# Patient Record
Sex: Male | Born: 1935
Health system: Southern US, Community
[De-identification: ages and names within clinical notes are randomized; demographics above are authoritative.]

## PROBLEM LIST (undated history)

## (undated) DIAGNOSIS — Z87898 Personal history of other specified conditions: Secondary | ICD-10-CM

## (undated) DIAGNOSIS — T7840XA Allergy, unspecified, initial encounter: Secondary | ICD-10-CM

## (undated) DIAGNOSIS — I4891 Unspecified atrial fibrillation: Secondary | ICD-10-CM

## (undated) DIAGNOSIS — I10 Essential (primary) hypertension: Secondary | ICD-10-CM

## (undated) DIAGNOSIS — N183 Chronic kidney disease, stage 3 unspecified: Secondary | ICD-10-CM

## (undated) DIAGNOSIS — M109 Gout, unspecified: Secondary | ICD-10-CM

## (undated) DIAGNOSIS — I878 Other specified disorders of veins: Secondary | ICD-10-CM

## (undated) DIAGNOSIS — C61 Malignant neoplasm of prostate: Secondary | ICD-10-CM

## (undated) DIAGNOSIS — H409 Unspecified glaucoma: Secondary | ICD-10-CM

## (undated) DIAGNOSIS — C4491 Basal cell carcinoma of skin, unspecified: Secondary | ICD-10-CM

## (undated) DIAGNOSIS — E785 Hyperlipidemia, unspecified: Secondary | ICD-10-CM

## (undated) DIAGNOSIS — R7301 Impaired fasting glucose: Secondary | ICD-10-CM

## (undated) HISTORY — PX: COLON SURGERY: SHX602

## (undated) HISTORY — DX: Hyperlipidemia, unspecified: E78.5

## (undated) HISTORY — DX: Essential (primary) hypertension: I10

## (undated) HISTORY — DX: Chronic kidney disease, stage 3 (moderate): N18.3

## (undated) HISTORY — DX: Other specified disorders of veins: I87.8

## (undated) HISTORY — DX: Chronic kidney disease, stage 3 unspecified: N18.30

## (undated) HISTORY — DX: Impaired fasting glucose: R73.01

## (undated) HISTORY — DX: Allergy, unspecified, initial encounter: T78.40XA

## (undated) HISTORY — PX: PROSTATE SURGERY: SHX751

## (undated) HISTORY — DX: Gout, unspecified: M10.9

## (undated) HISTORY — DX: Unspecified atrial fibrillation: I48.91

## (undated) HISTORY — DX: Unspecified glaucoma: H40.9

## (undated) HISTORY — PX: APPENDECTOMY: SHX54

## (undated) HISTORY — DX: Personal history of other specified conditions: Z87.898

## (undated) HISTORY — DX: Basal cell carcinoma of skin, unspecified: C44.91

## (undated) HISTORY — DX: Malignant neoplasm of prostate: C61

## (undated) HISTORY — PX: TONSILECTOMY, ADENOIDECTOMY, BILATERAL MYRINGOTOMY AND TUBES: SHX2538

---

## 1998-03-09 ENCOUNTER — Emergency Department (HOSPITAL_COMMUNITY): Admission: EM | Admit: 1998-03-09 | Discharge: 1998-03-09 | Payer: Self-pay | Admitting: Emergency Medicine

## 1998-12-07 ENCOUNTER — Emergency Department (HOSPITAL_COMMUNITY): Admission: EM | Admit: 1998-12-07 | Discharge: 1998-12-07 | Payer: Self-pay | Admitting: Emergency Medicine

## 1998-12-07 ENCOUNTER — Encounter: Payer: Self-pay | Admitting: Emergency Medicine

## 2000-05-31 ENCOUNTER — Inpatient Hospital Stay (HOSPITAL_COMMUNITY): Admission: AD | Admit: 2000-05-31 | Discharge: 2000-06-03 | Payer: Self-pay | Admitting: *Deleted

## 2000-06-25 ENCOUNTER — Encounter: Payer: Self-pay | Admitting: *Deleted

## 2000-06-25 ENCOUNTER — Ambulatory Visit (HOSPITAL_COMMUNITY): Admission: RE | Admit: 2000-06-25 | Discharge: 2000-06-25 | Payer: Self-pay | Admitting: *Deleted

## 2000-08-02 ENCOUNTER — Ambulatory Visit (HOSPITAL_COMMUNITY): Admission: RE | Admit: 2000-08-02 | Discharge: 2000-08-02 | Payer: Self-pay | Admitting: Interventional Cardiology

## 2000-08-30 ENCOUNTER — Ambulatory Visit (HOSPITAL_COMMUNITY): Admission: RE | Admit: 2000-08-30 | Discharge: 2000-08-30 | Payer: Self-pay | Admitting: Interventional Cardiology

## 2000-11-01 ENCOUNTER — Encounter (INDEPENDENT_AMBULATORY_CARE_PROVIDER_SITE_OTHER): Payer: Self-pay

## 2000-11-01 ENCOUNTER — Ambulatory Visit (HOSPITAL_COMMUNITY): Admission: RE | Admit: 2000-11-01 | Discharge: 2000-11-01 | Payer: Self-pay | Admitting: *Deleted

## 2002-06-16 ENCOUNTER — Ambulatory Visit: Admission: RE | Admit: 2002-06-16 | Discharge: 2002-09-05 | Payer: Self-pay | Admitting: Radiation Oncology

## 2003-03-09 ENCOUNTER — Encounter: Payer: Self-pay | Admitting: Internal Medicine

## 2003-03-09 ENCOUNTER — Encounter: Admission: RE | Admit: 2003-03-09 | Discharge: 2003-03-09 | Payer: Self-pay | Admitting: Internal Medicine

## 2003-04-09 ENCOUNTER — Ambulatory Visit: Admission: RE | Admit: 2003-04-09 | Discharge: 2003-04-09 | Payer: Self-pay | Admitting: Radiation Oncology

## 2003-11-07 ENCOUNTER — Ambulatory Visit (HOSPITAL_COMMUNITY): Admission: RE | Admit: 2003-11-07 | Discharge: 2003-11-07 | Payer: Self-pay | Admitting: *Deleted

## 2003-11-07 ENCOUNTER — Encounter (INDEPENDENT_AMBULATORY_CARE_PROVIDER_SITE_OTHER): Payer: Self-pay | Admitting: *Deleted

## 2005-07-22 ENCOUNTER — Ambulatory Visit (HOSPITAL_COMMUNITY): Admission: RE | Admit: 2005-07-22 | Discharge: 2005-07-22 | Payer: Self-pay | Admitting: Internal Medicine

## 2008-10-11 ENCOUNTER — Encounter: Admission: RE | Admit: 2008-10-11 | Discharge: 2008-10-11 | Payer: Self-pay | Admitting: Internal Medicine

## 2009-05-24 ENCOUNTER — Emergency Department (HOSPITAL_COMMUNITY): Admission: EM | Admit: 2009-05-24 | Discharge: 2009-05-24 | Payer: Self-pay | Admitting: Emergency Medicine

## 2009-07-26 ENCOUNTER — Ambulatory Visit (HOSPITAL_BASED_OUTPATIENT_CLINIC_OR_DEPARTMENT_OTHER): Admission: RE | Admit: 2009-07-26 | Discharge: 2009-07-26 | Payer: Self-pay | Admitting: Urology

## 2010-09-11 LAB — BASIC METABOLIC PANEL
BUN: 22 mg/dL (ref 6–23)
CO2: 29 mEq/L (ref 19–32)
Calcium: 9.3 mg/dL (ref 8.4–10.5)
Chloride: 105 mEq/L (ref 96–112)
Creatinine, Ser: 1.32 mg/dL (ref 0.4–1.5)
GFR calc Af Amer: >60 mL/min (ref >60)
GFR calc non Af Amer: 52 mL/min — ABNORMAL LOW (ref >60)
Glucose, Bld: 122 mg/dL — ABNORMAL HIGH (ref 70–99)
Potassium: 4.5 mEq/L (ref 3.5–5.1)
Sodium: 138 mEq/L (ref 135–145)

## 2010-09-11 LAB — PROTIME-INR
INR: 0.97 (ref 0.00–1.49)
Prothrombin Time: 12.8 seconds (ref 11.6–15.2)

## 2010-09-11 LAB — HEMOGLOBIN: Hemoglobin: 14.6 g/dL (ref 13.0–17.0)

## 2010-09-23 LAB — CBC
HCT: 42.1 % (ref 39.0–52.0)
Platelets: 175 10*3/uL (ref 150–400)
RBC: 4.33 MIL/uL (ref 4.22–5.81)
WBC: 5.7 10*3/uL (ref 4.0–10.5)

## 2010-09-23 LAB — URINALYSIS, ROUTINE W REFLEX MICROSCOPIC
Bilirubin Urine: NEGATIVE
Glucose, UA: NEGATIVE mg/dL
Ketones, ur: NEGATIVE mg/dL
Protein, ur: >300 mg/dL — AB
Urobilinogen, UA: 0.2 mg/dL (ref 0.0–1.0)

## 2010-09-23 LAB — URINE MICROSCOPIC-ADD ON

## 2010-09-23 LAB — DIFFERENTIAL
Eosinophils Relative: 2 % (ref 0–5)
Lymphocytes Relative: 14 % (ref 12–46)
Lymphs Abs: 0.8 10*3/uL (ref 0.7–4.0)
Monocytes Relative: 12 % (ref 3–12)

## 2010-09-23 LAB — PROTIME-INR
INR: 2.89 — ABNORMAL HIGH (ref 0.00–1.49)
Prothrombin Time: 30 seconds — ABNORMAL HIGH (ref 11.6–15.2)

## 2010-11-07 NOTE — Cardiovascular Report (Signed)
[place]. [facility]  Patient:    Vincent Foster, Vincent Foster                    MRN: [id number] Proc. Date: 06/03/00 Adm. Date:  [id number] Attending:  Mora Appl CC:         Barbette Hair. Vaughan Basta., M.D.   Cardiac Catheterization  INDICATIONS FOR PROCEDURE:  Persistent atrial fibrillation.  DESCRIPTION OF PROCEDURE:  After obtaining written informed consent, anesthesia was provided by the anesthesia service.  Following appropriate sedation the patient was cardioverted from atrial fibrillation to sinus rhythm with a first-degree A-V block.  There was an occasional PVC.  The patient tolerated the procedure without adverse events.  He will be discharged to home.  He will continue with sotalol 80 mg p.o. b.i.d. as well as Coumadin for three weeks.  An INR will be obtained in four days as well as a repeat ECG. He will continue on Tiazac at 120 mg p.o. q.d. along with Cozaar for antihypertensive control.  IMPRESSION:  Successful cardioversion of atrial fibrillation to sinus rhythm with a first-degree A-V block. DD:  06/03/00 TD:  06/03/00 Job: [id number]

## 2010-11-07 NOTE — Op Note (Signed)
NAME:  Vincent Foster, Vincent Foster NO.:  [id number]   MEDICAL RECORD NO.:  [id number]                   PATIENT TYPE:  AMB   LOCATION:  ENDO                                 FACILITY:  [facility]   PHYSICIAN:  Georgiana Spinner, M.D.                 DATE OF BIRTH:  [birth year]   DATE OF PROCEDURE:  DATE OF DISCHARGE:                                 OPERATIVE REPORT   PROCEDURE:  Colonoscopy.   INDICATIONS FOR PROCEDURE:  Followup of colon polyps.   ANESTHESIA:  Demerol 30, Versed 1 mg.   DESCRIPTION OF PROCEDURE:  With the patient mildly sedated in the left  lateral decubitus position, a rectal exam was performed which was  unremarkable. Subsequently, the Olympus videoscopic colonoscope was inserted  in the rectum and passed under direct vision with pressure applied to the  abdomen.  We reached the cecum identified by the ileocecal valve and  appendiceal orifice both of which were photographed. From this point, the  colonoscope was slowly withdrawn taking circumferential views of the colonic  mucosa stopping only to photograph diverticular seen along the way in the  sigmoid colon which would be rated mild to moderate and stopping in the  rectum which appeared normal on direct and showed hemorrhoids on retroflexed  view. The endoscope was straightened and withdrawn. The patient's vital  signs and pulse oximeter remained stable. The patient tolerated the  procedure well without apparent complications.   FINDINGS:  Internal hemorrhoids and some diverticulosis in the sigmoid colon  otherwise an unremarkable exam.   PLAN:  Consider a repeat examination if applicable in five years.                                               Georgiana Spinner, M.D.    GMO/MEDQ  D:  11/07/2003  T:  11/07/2003  Job:  [id number]   cc:   Ike Bene, M.D.  [address]  [place]  [place] [address]  Fax: [phone]

## 2010-11-07 NOTE — Procedures (Signed)
[facility]  Patient:    Vincent Foster, Vincent Foster                    MRN: [id number] Proc. Date: 11/01/00 Adm. Date:  [id number] Attending:  Sabino Gasser                           Procedure Report  PROCEDURE:  Colonoscopy.  INDICATIONS:  Colon polyps.  ANESTHESIA:  Demerol 100 mg, Versed 9 mg.  DESCRIPTION OF PROCEDURE:  With the patient mildly sedated in the left lateral decubitus position, the Olympus videoscopic colonoscope was inserted into the rectum and passed under direct vision to the cecum.  To reach the cecum, we had to rotate the patient subsequently to the right lateral decubitus position with abdominal pressure applied.  In the cecum were two polyps, both fairly sessile and flat and could have easily been missed, but we photographed them and then using hot biopsy forceps technique and subsequently eradication technique with the hot biopsy forceps, setting of 20-20 blended current, these polyps were removed and/or eradicated.  The endoscope was then withdrawn, taking circumferential views of the entire colonic mucosa, stopping only in the rectum which appeared normal under direct view and showed small polyps on retroflex view which were thought may very well be hyperplastic, so they were photographed, and the largest one was cold biopsied only.  The endoscope was then straightened and withdrawn.  The patients vital signs and pulse oximeter remained stable.  The patient tolerated the procedure well without apparent complications.  FINDINGS:  Polyps at cecum, polyps of rectum.  Await biopsy report.  The patient will call me for results and follow up with me as an outpatient. Avoid Coumadin for the next three days. DD:  11/01/00 TD:  11/01/00 Job: [id number]

## 2010-11-07 NOTE — H&P (Signed)
[place]. [facility]  Patient:    Vincent Foster, Vincent Foster                    MRN: [id number] Adm. Date:  [id number] Attending:  Meade Maw A Dictator:   Anselm Lis, N.P.                         History and Physical  PRIMARY CARE Shaterria Sager:  Barbette Hair. Vaughan Basta., M.D.  DATE OF BIRTH:  02/28/1924  SUBJECTIVE:  [name] is a 77 year old gentleman initially seen in our clinic for a stress Cardiolite which was scheduled for symptoms of persistent shortness of breath.  At the time of the Cardiolite, which was mid-November, patient was noted to be in atrial fibrillation with his heart rate approximately 122 beats per minute.  He was initiated on Cardizem and Toprol, as well as warfarin.  His stress Cardiolite was notable for a small region of decreased uptake in the anterior wall from the base approaching the apex.  Since the patient was without chest pain, it was elected to proceed with medical management.  A 2-D echocardiogram performed on that day revealed EF of 45-50% with overall preserved LV function.  His left atrium was noted to be at 5.1.  He had mild aortic sclerosis without significant stenosis and mild MR.  He is now electively admitted for initiation of Betapace therapy and, following greater than 48 hours of Betapace, for elective direct-current cardioversion.  PREVIOUS MEDICAL HISTORY: 1. Atrial fibrillation, as above. 2. Hypertension. 3. Dislipidemia. 4. Allergic rhinitis. 5. Diverticulitis. 6. Question CAD with recent Cardiolite (mid-November 2001) revealing small    region of decreased uptake in the anterior wall from the base    approaching the apex.  PREVIOUS SURGICAL HISTORY:  A tonsillectomy and appendectomy.  ALLERGIES:  TETANUS, causing high fever.  MEDICATIONS: 1. Tiazac 240 mg p.o. q.d. 2. Toprol 100 mg p.o. q.d. 3. Cozaar 50 mg p.o. q.d. 4. Aspirin 325 mg q.d. 5. Multivitamin q.d. 6. Lipitor 10 mg p.o.  q.d. 7. Coumadin 5 mg a day except for Friday, at which time he takes a half    tablet. 8. Zyrtec 10 mg q.h.s. 9. Nasacort one puff each naris q.h.s.  SOCIAL HISTORY AND HABITS:  Patient is married for 52 years and has three children who are in good health.  He enjoys golfing and yard work.  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  He used to drink two scotches a day, but since starting the Coumadin has only had an occasional glass of wine.  Tobacco:  Negative.  FAMILY HISTORY:  Father deceased age 52.  Mother deceased age 36.  Sister is age 46 and in good health.  Has had a mastectomy.  Another sister who died of colon cancer at age 30.  REVIEW OF SYSTEMS:  As HPI/previous medical history.  Otherwise, denies problems with lightheadedness, dizziness, near-syncope, nor syncopal complaints.  Negative dysphagia to food or fluid.  Negative symptoms GERD. Denies melena nor bright red blood PR.  Does have nocturia of two to three times per night.  Negative pedal edema, orthopnea, nor PND.  PHYSICAL EXAMINATION:  VITAL SIGNS:  Blood pressure 180/90 with heart rate 80s and irregular. Temperature 96.5, respiratory rate 20.  He is 6 feet and weighs 241.9 pounds.  GENERAL:  Overweight older gentleman who appears younger than his 66 years of age.  His wife is in attendance.  HEENT:  Brisk bilateral carotid upstroke without bruit.  NECK:  No significant JVD nor thyromegaly.  CHEST:  Lung sounds clear with equal bilateral excursion.  CARDIAC:  Irregularly irregular rhythm.  Grade 2/6 holosystolic murmur best appreciated at the apex.  PMI appears to be within normal limits.  ABDOMEN:  Soft, nondistended, normoactive bowel sounds.  Negative abdominal aorta, no femoral bruit.  EXTREMITIES:  Negative pedal edema.  Good pedal pulses.  NEUROLOGIC:  Nonfocal.  Motor is 5/5 throughout.  GENITAL/RECTAL:  Deferred.  LABORATORY DATA:  EKG from today revealed atrial fibrillation  with ventricular response of 90 beats per minute.  No acute ischemic changes.  Occasional PVCs.  Stress Cardiolite from May 03, 2000 revealed decreased uptake anterior wall from the base approaching the apex, suggesting a previous anterior myocardial infarction involving the LAD.  BMET revealed sodium 136, potassium 4.2, chloride 104, CO2 24, glucose 94, BUN 12, creatinine 1.1, calcium 9.4, magnesium 1.9.  Pro time 27.3 with an INR of 3.8.  IMPRESSION: 1. Atrial fibrillation, basically asymptomatic except for shortness of breath    experienced with moderate exertion and when rate was not controlled.  He    is currently rate controlled on Cardizem and Toprol.  Toprol dose was    not taken this morning.  He is running about 90s. 2. Systemic anticoagulation secondary to #1.  He has been therapeutically    anticoagulated for greater than three weeks.  Management via our    Coumadin clinic. 3. Hypertension; elevated blood pressure upon admission today.  He had not    taken his Toprol dose this morning. 4. History of allergies; Zyrtec and Nasacort. 5. Question coronary artery disease.  Recent Cardiolite suspicious for scar in    anterior wall.  However, there is no complaint of chest pain though he    does have risk factors for CAD including age, history of dislipidemia,    hypertension.  Elected to follow medically at this time.  PLAN: 1. Elective admission for initiation of Betapace therapy towards possible    chemical cardioversion then, if failing this, elective direct-current    cardioversion after two full days of Betapace therapy (anticipate    Thursday morning, December 13 if remains in atrial fibrillation). 2. Will decrease warfarin to one-half tablet tonight with following of daily    INR and subsequent adjustment pending results. 3. Will discontinue Toprol in the setting of initiating Betapace therapy. DD:  05/31/00 TD:  05/31/00 Job: [id number]
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
## 2010-11-07 NOTE — Op Note (Signed)
NAME:  Vincent Foster, Vincent Foster NO.:  [id number]   MEDICAL RECORD NO.:  [id number]                   PATIENT TYPE:  AMB   LOCATION:  ENDO                                 FACILITY:  [facility]   PHYSICIAN:  Georgiana Spinner, M.D.                 DATE OF BIRTH:  1923/09/12   DATE OF PROCEDURE:  DATE OF DISCHARGE:                                 OPERATIVE REPORT   PROCEDURE:  Upper endoscopy.   INDICATIONS FOR PROCEDURE:  Gastroesophageal reflux disease.  Patient on  aspirin and Coumadin previously.   ANESTHESIA:  Demerol 50, Versed 8 mg.   DESCRIPTION OF PROCEDURE:  With the patient mildly sedated in the left  lateral decubitus position, the Olympus videoscopic endoscope was inserted  in the mouth and passed under direct vision through the esophagus which  appeared normal into the stomach. The fundus, body and antrum appeared  normal. The duodenal bulb showed some mild mucosal thickening consistent  with lymphoid hyperplasia. This was photographed and biopsied. The second  portion of the duodenum appeared normal.  From this point, the endoscope was  slowly withdrawn taking circumferential views of the duodenal mucosa until  the endoscope was then pulled back in the stomach, placed in retroflexion to  view the stomach from below. The endoscope was then straightened and  withdrawn taking circumferential views of the remaining gastric and  esophageal mucosa. The patient's vital signs and pulse oximeter remained  stable. The patient tolerated the procedure well without apparent  complications.   FINDINGS:  Duodenitis biopsied. Await biopsy report. The patient will call  me for results and followup with me as an outpatient. Proceed to colonoscopy  as planned.                                               Georgiana Spinner, M.D.    GMO/MEDQ  D:  11/07/2003  T:  11/07/2003  Job:  [id number]   cc:   Ike Bene, M.D.  [address]  [place]  [place] [address]  Fax: [phone]

## 2010-11-07 NOTE — Cardiovascular Report (Signed)
[place]. [facility]  Patient:    Vincent Foster, Vincent Foster                    MRN: [id number] Proc. Date: 08/02/00 Adm. Date:  [id number] Disc. Date: [id number] Attending:  Meade Maw A CC:         Judie Petit, M.D.  Barbette Hair. Vaughan Basta., M.D.   Cardiac Catheterization  INDICATIONS:  Atrial fibrillation.  The patient has been loaded with amiodarone for the past six weeks, and he is adequately anticoagulated with Coumadin with an INR today of 3.0.  Previous cardioversion was successful but he reverted to atrial fibrillation within 48 hours on Betapace.  PROCEDURES PERFORMED:  Elective electrical cardioversion.  DESCRIPTION OF PROCEDURE:  The patient was seen by anesthesiologist, Dr. Judie Petit.  After her evaluation, he was given sodium Pentothal.  A total of 275 mg was used to achieve sedation.  Cardioversion was then performed in the synchronized mode using the biphasic device at 300 joules. When, discharged led to normal sinus rhythm with PVCs.  Pulse conversion ECG revealed normal sinus rhythm, first-degree A-V block at 252 msec and occasional PVCs.  CONCLUSIONS:  Successful conversion from atrial fibrillation to normal sinus rhythm.  PLAN:  Continue medications as listed.  Will discontinued Tiazac.  Will continue Toprol 100 mg per day, amiodarone 200 mg per day.  Need to consider coronary angiography to define coronary anatomy given the abnormalities found on the patients Cardiolite study. DD:  08/02/00 TD:  08/03/00 Job: [id number]

## 2010-11-07 NOTE — Cardiovascular Report (Signed)
[place]. [facility]  Patient:    Vincent Foster, Vincent Foster                    MRN: [id number] Proc. Date: 08/30/00 Adm. Date:  [id number] Attending:  Lyn Records. Iii CC:         Barbette Hair. Vaughan Basta., M.D.   Cardiac Catheterization  INDICATIONS:  The patient has atrial fibrillation.  He has had atypical chest discomfort.  Cardiolite performed by Dr. Meade Maw was interpreted to show evidence of possible coronary artery disease.  This study is being done to define the patients coronary anatomy and rule out significant coronary artery disease.  CARDIOLOGIST:  Celso Sickle, M.D.  PROCEDURES PERFORMED: 1. Left heart catheterization. 2. Selective coronary angioplasty. 3. Left ventriculography. 4. Perclose arteriotomy closure.  DESCRIPTION:  After informed consent, a 6-French sheath was inserted into the right femoral artery using the modified Seldinger technique.  A 6-French A2 multipurpose catheter was used for hemodynamic recordings, left ventriculography, and coronary angiography.  Number 4 and 6-French left Judkins catheter was used for left coronary angiography.  The patient tolerated the diagnostic procedure without complications.  After coronary angiography, Perclose was then used to perform arteriotomy closure without complications.  IV Ancef 1 g was administered to prevent infection.  RESULTS: I.   Hemodynamic data:      a. Aortic pressure 162/72 mmHg.      b. Left ventricular pressure 157/16 mmHg.  II.  Left ventriculography:  The left ventricle is normal in size and      demonstrate normal contractility.  No mitral regurgitation is noted.      EF is at least 60%.  III. Selective coronary angiography.      a. Left main coronary: Normal.      b. Left anterior descending coronary: This is a large vessel that reaches         the left ventricular apex.  No discrete lesions are noted.  Minimal         luminal irregularities are  noted.      c. Circumflex artery: The circumflex coronary artery is large, giving         origin to a bifurcating obtuse marginal.  No significant obstruction         is noted.  Perhaps 30 to 40% narrowing is noted in the first obtuse         marginal mid segment.      d. Right coronary: The right coronary artery is large.  There is a         proximal 30% narrowing.  PDA is large.  Several small left ventricular         branches arise distally.  CONCLUSIONS: 1. Normal left ventricular function. 2. Minimal coronary atherosclerotic heart disease with 30 to 40% proximal    right coronary artery and 30 to 40% first obtuse marginal stenoses.    The left anterior descending artery contains no significant obstruction. 3. Chronic atrial fibrillation. 4. Successful Perclose.  PLAN:  Resume Coumadin.  Continue medications for atrial fibrillation. DD:  08/30/00 TD:  08/30/00 Job: [id number]

## 2010-12-10 ENCOUNTER — Other Ambulatory Visit: Payer: Self-pay | Admitting: Dermatology

## 2011-06-10 ENCOUNTER — Other Ambulatory Visit: Payer: Self-pay | Admitting: Dermatology

## 2011-06-26 DIAGNOSIS — Z7901 Long term (current) use of anticoagulants: Secondary | ICD-10-CM | POA: Diagnosis not present

## 2011-06-26 DIAGNOSIS — I4891 Unspecified atrial fibrillation: Secondary | ICD-10-CM | POA: Diagnosis not present

## 2011-07-01 DIAGNOSIS — M109 Gout, unspecified: Secondary | ICD-10-CM | POA: Diagnosis not present

## 2011-07-08 ENCOUNTER — Other Ambulatory Visit: Payer: Self-pay | Admitting: Dermatology

## 2011-07-08 DIAGNOSIS — C44320 Squamous cell carcinoma of skin of unspecified parts of face: Secondary | ICD-10-CM | POA: Diagnosis not present

## 2011-07-08 DIAGNOSIS — L821 Other seborrheic keratosis: Secondary | ICD-10-CM | POA: Diagnosis not present

## 2011-08-07 DIAGNOSIS — Z7901 Long term (current) use of anticoagulants: Secondary | ICD-10-CM | POA: Diagnosis not present

## 2011-08-07 DIAGNOSIS — I4891 Unspecified atrial fibrillation: Secondary | ICD-10-CM | POA: Diagnosis not present

## 2011-09-17 DIAGNOSIS — I4891 Unspecified atrial fibrillation: Secondary | ICD-10-CM | POA: Diagnosis not present

## 2011-09-17 DIAGNOSIS — Z7901 Long term (current) use of anticoagulants: Secondary | ICD-10-CM | POA: Diagnosis not present

## 2011-10-07 DIAGNOSIS — R269 Unspecified abnormalities of gait and mobility: Secondary | ICD-10-CM | POA: Diagnosis not present

## 2011-10-07 DIAGNOSIS — I10 Essential (primary) hypertension: Secondary | ICD-10-CM | POA: Diagnosis not present

## 2011-10-07 DIAGNOSIS — E785 Hyperlipidemia, unspecified: Secondary | ICD-10-CM | POA: Diagnosis not present

## 2011-10-14 DIAGNOSIS — H3530 Unspecified macular degeneration: Secondary | ICD-10-CM | POA: Diagnosis not present

## 2011-10-14 DIAGNOSIS — Z961 Presence of intraocular lens: Secondary | ICD-10-CM | POA: Diagnosis not present

## 2011-10-14 DIAGNOSIS — H02109 Unspecified ectropion of unspecified eye, unspecified eyelid: Secondary | ICD-10-CM | POA: Diagnosis not present

## 2011-10-14 DIAGNOSIS — D487 Neoplasm of uncertain behavior of other specified sites: Secondary | ICD-10-CM | POA: Diagnosis not present

## 2011-10-20 ENCOUNTER — Other Ambulatory Visit: Payer: Self-pay | Admitting: Ophthalmology

## 2011-10-20 DIAGNOSIS — H119 Unspecified disorder of conjunctiva: Secondary | ICD-10-CM | POA: Diagnosis not present

## 2011-10-20 DIAGNOSIS — D3100 Benign neoplasm of unspecified conjunctiva: Secondary | ICD-10-CM | POA: Diagnosis not present

## 2011-10-29 DIAGNOSIS — I4891 Unspecified atrial fibrillation: Secondary | ICD-10-CM | POA: Diagnosis not present

## 2011-10-29 DIAGNOSIS — Z7901 Long term (current) use of anticoagulants: Secondary | ICD-10-CM | POA: Diagnosis not present

## 2011-11-12 ENCOUNTER — Other Ambulatory Visit: Payer: Self-pay | Admitting: Dermatology

## 2011-11-12 DIAGNOSIS — C44320 Squamous cell carcinoma of skin of unspecified parts of face: Secondary | ICD-10-CM | POA: Diagnosis not present

## 2011-11-12 DIAGNOSIS — Z85828 Personal history of other malignant neoplasm of skin: Secondary | ICD-10-CM | POA: Diagnosis not present

## 2011-11-12 DIAGNOSIS — L821 Other seborrheic keratosis: Secondary | ICD-10-CM | POA: Diagnosis not present

## 2011-11-12 DIAGNOSIS — L570 Actinic keratosis: Secondary | ICD-10-CM | POA: Diagnosis not present

## 2011-11-12 DIAGNOSIS — D0439 Carcinoma in situ of skin of other parts of face: Secondary | ICD-10-CM | POA: Diagnosis not present

## 2011-11-19 DIAGNOSIS — M109 Gout, unspecified: Secondary | ICD-10-CM | POA: Diagnosis not present

## 2011-11-30 DIAGNOSIS — I4891 Unspecified atrial fibrillation: Secondary | ICD-10-CM | POA: Diagnosis not present

## 2011-11-30 DIAGNOSIS — Z7901 Long term (current) use of anticoagulants: Secondary | ICD-10-CM | POA: Diagnosis not present

## 2012-01-05 DIAGNOSIS — I4891 Unspecified atrial fibrillation: Secondary | ICD-10-CM | POA: Diagnosis not present

## 2012-01-05 DIAGNOSIS — Z7901 Long term (current) use of anticoagulants: Secondary | ICD-10-CM | POA: Diagnosis not present

## 2012-01-05 DIAGNOSIS — M109 Gout, unspecified: Secondary | ICD-10-CM | POA: Diagnosis not present

## 2012-01-19 DIAGNOSIS — I4891 Unspecified atrial fibrillation: Secondary | ICD-10-CM | POA: Diagnosis not present

## 2012-01-19 DIAGNOSIS — Z7901 Long term (current) use of anticoagulants: Secondary | ICD-10-CM | POA: Diagnosis not present

## 2012-01-26 DIAGNOSIS — M109 Gout, unspecified: Secondary | ICD-10-CM | POA: Diagnosis not present

## 2012-02-03 DIAGNOSIS — I10 Essential (primary) hypertension: Secondary | ICD-10-CM | POA: Diagnosis not present

## 2012-02-03 DIAGNOSIS — I4891 Unspecified atrial fibrillation: Secondary | ICD-10-CM | POA: Diagnosis not present

## 2012-02-03 DIAGNOSIS — I5032 Chronic diastolic (congestive) heart failure: Secondary | ICD-10-CM | POA: Diagnosis not present

## 2012-02-03 DIAGNOSIS — E785 Hyperlipidemia, unspecified: Secondary | ICD-10-CM | POA: Diagnosis not present

## 2012-02-03 DIAGNOSIS — Z7901 Long term (current) use of anticoagulants: Secondary | ICD-10-CM | POA: Diagnosis not present

## 2012-02-10 DIAGNOSIS — Z7901 Long term (current) use of anticoagulants: Secondary | ICD-10-CM | POA: Diagnosis not present

## 2012-02-10 DIAGNOSIS — I4891 Unspecified atrial fibrillation: Secondary | ICD-10-CM | POA: Diagnosis not present

## 2012-03-02 DIAGNOSIS — Z7901 Long term (current) use of anticoagulants: Secondary | ICD-10-CM | POA: Diagnosis not present

## 2012-03-02 DIAGNOSIS — I4891 Unspecified atrial fibrillation: Secondary | ICD-10-CM | POA: Diagnosis not present

## 2012-04-08 DIAGNOSIS — Z23 Encounter for immunization: Secondary | ICD-10-CM | POA: Diagnosis not present

## 2012-04-08 DIAGNOSIS — I10 Essential (primary) hypertension: Secondary | ICD-10-CM | POA: Diagnosis not present

## 2012-04-08 DIAGNOSIS — M109 Gout, unspecified: Secondary | ICD-10-CM | POA: Diagnosis not present

## 2012-04-08 DIAGNOSIS — Z7901 Long term (current) use of anticoagulants: Secondary | ICD-10-CM | POA: Diagnosis not present

## 2012-04-08 DIAGNOSIS — G479 Sleep disorder, unspecified: Secondary | ICD-10-CM | POA: Diagnosis not present

## 2012-04-08 DIAGNOSIS — I4891 Unspecified atrial fibrillation: Secondary | ICD-10-CM | POA: Diagnosis not present

## 2012-04-13 ENCOUNTER — Other Ambulatory Visit: Payer: Self-pay | Admitting: Dermatology

## 2012-04-13 DIAGNOSIS — D0470 Carcinoma in situ of skin of unspecified lower limb, including hip: Secondary | ICD-10-CM | POA: Diagnosis not present

## 2012-04-13 DIAGNOSIS — Z85828 Personal history of other malignant neoplasm of skin: Secondary | ICD-10-CM | POA: Diagnosis not present

## 2012-04-13 DIAGNOSIS — D0439 Carcinoma in situ of skin of other parts of face: Secondary | ICD-10-CM | POA: Diagnosis not present

## 2012-04-13 DIAGNOSIS — L821 Other seborrheic keratosis: Secondary | ICD-10-CM | POA: Diagnosis not present

## 2012-04-13 DIAGNOSIS — L570 Actinic keratosis: Secondary | ICD-10-CM | POA: Diagnosis not present

## 2012-04-26 ENCOUNTER — Ambulatory Visit: Payer: Medicare Other

## 2012-04-26 ENCOUNTER — Ambulatory Visit (INDEPENDENT_AMBULATORY_CARE_PROVIDER_SITE_OTHER): Payer: Medicare Other | Admitting: Family Medicine

## 2012-04-26 ENCOUNTER — Other Ambulatory Visit: Payer: Self-pay

## 2012-04-26 VITALS — BP 152/102 | HR 106 | Temp 97.9°F | Resp 18 | Wt 247.0 lb

## 2012-04-26 DIAGNOSIS — S96912A Strain of unspecified muscle and tendon at ankle and foot level, left foot, initial encounter: Secondary | ICD-10-CM

## 2012-04-26 DIAGNOSIS — M79673 Pain in unspecified foot: Secondary | ICD-10-CM

## 2012-04-26 DIAGNOSIS — IMO0002 Reserved for concepts with insufficient information to code with codable children: Secondary | ICD-10-CM

## 2012-04-26 DIAGNOSIS — M79609 Pain in unspecified limb: Secondary | ICD-10-CM | POA: Diagnosis not present

## 2012-04-26 DIAGNOSIS — S93409A Sprain of unspecified ligament of unspecified ankle, initial encounter: Secondary | ICD-10-CM | POA: Diagnosis not present

## 2012-04-26 DIAGNOSIS — M25473 Effusion, unspecified ankle: Secondary | ICD-10-CM | POA: Diagnosis not present

## 2012-04-26 DIAGNOSIS — M25476 Effusion, unspecified foot: Secondary | ICD-10-CM

## 2012-04-26 DIAGNOSIS — M25579 Pain in unspecified ankle and joints of unspecified foot: Secondary | ICD-10-CM | POA: Diagnosis not present

## 2012-04-26 NOTE — Progress Notes (Signed)
[address]   [place], [place]  [address]   [phone]  Subjective:    Patient ID: Vincent Foster, male    DOB: [birth year], 78 y.o.   MRN: [id number]  HPIThis 78 y.o. male presents for evaluation of L foot pain.  Larey Seat five days ago. Larey Seat down a few steps; caught self with raling.  Swelling lateral ankle and foot.  Grandson had CAM walker/boot and has been wearing boot.  Also using walker.  Has been using a cane at baseline.   Rare falls.  Mild numbness in foot.   No ice or heat.  No medication.  Takes Coumadin; last INR three weeks ago.  No change in dose; INR 2.5. Called Dr. Valentina Lucks and recommended xray.     Review of Systems  Constitutional: Negative for fever, chills, diaphoresis and fatigue.  Musculoskeletal: Positive for myalgias, joint swelling and arthralgias.  Skin: Negative for color change, pallor, rash and wound.  Neurological: Positive for numbness. Negative for weakness.    Past Medical History  Diagnosis Date  . Allergy   . Glaucoma   . Atrial fibrillation   . Hyperlipidemia   . Hypertension   . Venous stasis     Lasix PRN.    Past Surgical History  Procedure Date  . Appendectomy   . Prostate surgery     Had gold seed placement  . Tonsilectomy, adenoidectomy, bilateral myringotomy and tubes   . Colon surgery     Prior to Admission medications   Medication Sig Start Date End Date Taking? Authorizing Provider  aspirin 81 MG tablet Take 81 mg by mouth daily.   Yes Historical Provider, MD  furosemide (LASIX) 20 MG tablet Take 20 mg by mouth 2 (two) times daily.   Yes Historical Provider, MD  losartan (COZAAR) 50 MG tablet Take 50 mg by mouth daily.   Yes Historical Provider, MD  metoprolol succinate (TOPROL-XL) 50 MG 24 hr tablet Take 50 mg by mouth daily. Take with or immediately following a meal.   Yes Historical Provider, MD  simvastatin (ZOCOR) 40 MG tablet Take 40 mg by mouth every evening.   Yes Historical Provider, MD  warfarin (COUMADIN) 3 MG  tablet Take 3 mg by mouth daily.   Yes Historical Provider, MD    Allergies  Allergen Reactions  . Tetanus Toxoids Other (See Comments)    He ran a real high fever    History   Social History  . Marital Status: Married    Spouse Name: N/A    Number of Children: N/A  . Years of Education: N/A   Occupational History  . Not on file.   Social History Main Topics  . Smoking status: Former Games developer  . Smokeless tobacco: Not on file  . Alcohol Use: Not on file  . Drug Use: No  . Sexually Active: Not on file   Other Topics Concern  . Not on file   Social History Narrative  . No narrative on file    No family history on file.     Objective:   Physical Exam  Nursing note and vitals reviewed. Constitutional: He is oriented to person, place, and time. He appears well-developed and well-nourished. No distress.  Musculoskeletal:       Left ankle: He exhibits decreased range of motion and swelling. He exhibits no ecchymosis, no laceration and normal pulse. tenderness. Head of 5th metatarsal tenderness found. No lateral malleolus, no medial malleolus and no proximal fibula tenderness found. Achilles  tendon normal.       Left foot: He exhibits decreased range of motion, tenderness, bony tenderness and swelling. He exhibits normal capillary refill, no crepitus, no deformity and no laceration.       L FOOT/ANKLE: MODERATE SWELLING LATERAL ANKLE; NON-TENDER ALONG LATERAL MALLEOLUS.  +TTP INFERIOR TO LATERAL MALLEOLUS.  +SWELLING PROXIMAL FOOT; +TTP 5TH METATARSAL PROXIMALLY; MILD TTP TARSAL REGION PROXIMAL FOOT.  FULL DORSIFLEXION, PLANTAR FLEXION.   Neurological: He is alert and oriented to person, place, and time. No cranial nerve deficit. He exhibits normal muscle tone.  Skin: Skin is warm and dry. No rash noted. He is not diaphoretic. No erythema.       NO ECCHYMOSES.  Psychiatric: He has a normal mood and affect. His behavior is normal. Judgment and thought content normal.     [facility]  reading (PRIMARY) by  Dr. Katrinka Blazing.  L ANKLE:  NAD L FOOT:  NAD   Assessment & Plan:   1. Foot pain  DG Foot Complete Left, DG Ankle Complete Left  2. Ankle pain    3. Strain of ankle or foot, left    4. Swelling of joint of left ankle or foot      1.  Foot/Ankle Pain L:  New. Recommend rest, ice, elevation, Tylenol. 2.  Foot/Ankle Contusion L;  New.  Continue CAM walker, ambulating with walker.  Recommend rest, elevation, ice.

## 2012-04-26 NOTE — Patient Instructions (Addendum)
1. Foot pain  DG Foot Complete Left, DG Ankle Complete Left  2. Ankle pain    3. Strain of ankle or foot, left    4. Swelling of joint of left ankle or foot

## 2012-06-09 DIAGNOSIS — I4891 Unspecified atrial fibrillation: Secondary | ICD-10-CM | POA: Diagnosis not present

## 2012-06-09 DIAGNOSIS — Z7901 Long term (current) use of anticoagulants: Secondary | ICD-10-CM | POA: Diagnosis not present

## 2012-07-07 DIAGNOSIS — I4891 Unspecified atrial fibrillation: Secondary | ICD-10-CM | POA: Diagnosis not present

## 2012-07-07 DIAGNOSIS — Z7901 Long term (current) use of anticoagulants: Secondary | ICD-10-CM | POA: Diagnosis not present

## 2012-07-18 DIAGNOSIS — Z961 Presence of intraocular lens: Secondary | ICD-10-CM | POA: Diagnosis not present

## 2012-07-18 DIAGNOSIS — H2640 Unspecified secondary cataract: Secondary | ICD-10-CM | POA: Diagnosis not present

## 2012-07-18 DIAGNOSIS — H43819 Vitreous degeneration, unspecified eye: Secondary | ICD-10-CM | POA: Diagnosis not present

## 2012-07-21 DIAGNOSIS — H26499 Other secondary cataract, unspecified eye: Secondary | ICD-10-CM | POA: Diagnosis not present

## 2012-07-21 DIAGNOSIS — H2640 Unspecified secondary cataract: Secondary | ICD-10-CM | POA: Diagnosis not present

## 2012-08-01 NOTE — Progress Notes (Signed)
Reviewed and agree.

## 2012-08-18 DIAGNOSIS — Z7901 Long term (current) use of anticoagulants: Secondary | ICD-10-CM | POA: Diagnosis not present

## 2012-08-18 DIAGNOSIS — I4891 Unspecified atrial fibrillation: Secondary | ICD-10-CM | POA: Diagnosis not present

## 2012-08-31 ENCOUNTER — Other Ambulatory Visit: Payer: Self-pay | Admitting: Dermatology

## 2012-08-31 DIAGNOSIS — D485 Neoplasm of uncertain behavior of skin: Secondary | ICD-10-CM | POA: Diagnosis not present

## 2012-08-31 DIAGNOSIS — C44319 Basal cell carcinoma of skin of other parts of face: Secondary | ICD-10-CM | POA: Diagnosis not present

## 2012-08-31 DIAGNOSIS — Z85828 Personal history of other malignant neoplasm of skin: Secondary | ICD-10-CM | POA: Diagnosis not present

## 2012-08-31 DIAGNOSIS — D0470 Carcinoma in situ of skin of unspecified lower limb, including hip: Secondary | ICD-10-CM | POA: Diagnosis not present

## 2012-08-31 DIAGNOSIS — L570 Actinic keratosis: Secondary | ICD-10-CM | POA: Diagnosis not present

## 2012-08-31 DIAGNOSIS — L821 Other seborrheic keratosis: Secondary | ICD-10-CM | POA: Diagnosis not present

## 2012-09-29 DIAGNOSIS — I4891 Unspecified atrial fibrillation: Secondary | ICD-10-CM | POA: Diagnosis not present

## 2012-09-29 DIAGNOSIS — Z7901 Long term (current) use of anticoagulants: Secondary | ICD-10-CM | POA: Diagnosis not present

## 2012-10-03 DIAGNOSIS — C44319 Basal cell carcinoma of skin of other parts of face: Secondary | ICD-10-CM | POA: Diagnosis not present

## 2012-10-17 ENCOUNTER — Other Ambulatory Visit: Payer: Self-pay | Admitting: Dermatology

## 2012-10-17 DIAGNOSIS — C44529 Squamous cell carcinoma of skin of other part of trunk: Secondary | ICD-10-CM | POA: Diagnosis not present

## 2012-10-17 DIAGNOSIS — D485 Neoplasm of uncertain behavior of skin: Secondary | ICD-10-CM | POA: Diagnosis not present

## 2012-10-28 DIAGNOSIS — I10 Essential (primary) hypertension: Secondary | ICD-10-CM | POA: Diagnosis not present

## 2012-10-28 DIAGNOSIS — J449 Chronic obstructive pulmonary disease, unspecified: Secondary | ICD-10-CM | POA: Diagnosis not present

## 2012-10-28 DIAGNOSIS — E785 Hyperlipidemia, unspecified: Secondary | ICD-10-CM | POA: Diagnosis not present

## 2012-10-28 DIAGNOSIS — Z1331 Encounter for screening for depression: Secondary | ICD-10-CM | POA: Diagnosis not present

## 2012-10-31 ENCOUNTER — Other Ambulatory Visit: Payer: Self-pay | Admitting: Dermatology

## 2012-10-31 DIAGNOSIS — C44529 Squamous cell carcinoma of skin of other part of trunk: Secondary | ICD-10-CM | POA: Diagnosis not present

## 2012-11-10 DIAGNOSIS — Z7901 Long term (current) use of anticoagulants: Secondary | ICD-10-CM | POA: Diagnosis not present

## 2012-11-10 DIAGNOSIS — I4891 Unspecified atrial fibrillation: Secondary | ICD-10-CM | POA: Diagnosis not present

## 2012-12-22 DIAGNOSIS — I4891 Unspecified atrial fibrillation: Secondary | ICD-10-CM | POA: Diagnosis not present

## 2012-12-22 DIAGNOSIS — Z7901 Long term (current) use of anticoagulants: Secondary | ICD-10-CM | POA: Diagnosis not present

## 2013-01-03 ENCOUNTER — Other Ambulatory Visit: Payer: Self-pay | Admitting: Dermatology

## 2013-01-03 DIAGNOSIS — Z85828 Personal history of other malignant neoplasm of skin: Secondary | ICD-10-CM | POA: Diagnosis not present

## 2013-01-03 DIAGNOSIS — L570 Actinic keratosis: Secondary | ICD-10-CM | POA: Diagnosis not present

## 2013-01-03 DIAGNOSIS — D485 Neoplasm of uncertain behavior of skin: Secondary | ICD-10-CM | POA: Diagnosis not present

## 2013-01-03 DIAGNOSIS — L820 Inflamed seborrheic keratosis: Secondary | ICD-10-CM | POA: Diagnosis not present

## 2013-01-03 DIAGNOSIS — L821 Other seborrheic keratosis: Secondary | ICD-10-CM | POA: Diagnosis not present

## 2013-01-03 DIAGNOSIS — D0470 Carcinoma in situ of skin of unspecified lower limb, including hip: Secondary | ICD-10-CM | POA: Diagnosis not present

## 2013-02-02 DIAGNOSIS — I4891 Unspecified atrial fibrillation: Secondary | ICD-10-CM | POA: Diagnosis not present

## 2013-02-02 DIAGNOSIS — Z7901 Long term (current) use of anticoagulants: Secondary | ICD-10-CM | POA: Diagnosis not present

## 2013-02-08 DIAGNOSIS — I4891 Unspecified atrial fibrillation: Secondary | ICD-10-CM | POA: Diagnosis not present

## 2013-02-08 DIAGNOSIS — Z7901 Long term (current) use of anticoagulants: Secondary | ICD-10-CM | POA: Diagnosis not present

## 2013-02-08 DIAGNOSIS — I5032 Chronic diastolic (congestive) heart failure: Secondary | ICD-10-CM | POA: Diagnosis not present

## 2013-02-08 DIAGNOSIS — J449 Chronic obstructive pulmonary disease, unspecified: Secondary | ICD-10-CM | POA: Diagnosis not present

## 2013-02-08 DIAGNOSIS — R4181 Age-related cognitive decline: Secondary | ICD-10-CM | POA: Diagnosis not present

## 2013-02-24 DIAGNOSIS — I4891 Unspecified atrial fibrillation: Secondary | ICD-10-CM | POA: Diagnosis not present

## 2013-02-24 DIAGNOSIS — Z7901 Long term (current) use of anticoagulants: Secondary | ICD-10-CM | POA: Diagnosis not present

## 2013-03-09 DIAGNOSIS — H3530 Unspecified macular degeneration: Secondary | ICD-10-CM | POA: Diagnosis not present

## 2013-03-09 DIAGNOSIS — H01009 Unspecified blepharitis unspecified eye, unspecified eyelid: Secondary | ICD-10-CM | POA: Diagnosis not present

## 2013-03-09 DIAGNOSIS — H04229 Epiphora due to insufficient drainage, unspecified lacrimal gland: Secondary | ICD-10-CM | POA: Diagnosis not present

## 2013-03-09 DIAGNOSIS — H0019 Chalazion unspecified eye, unspecified eyelid: Secondary | ICD-10-CM | POA: Diagnosis not present

## 2013-04-01 ENCOUNTER — Other Ambulatory Visit: Payer: Self-pay | Admitting: Interventional Cardiology

## 2013-04-03 ENCOUNTER — Ambulatory Visit (INDEPENDENT_AMBULATORY_CARE_PROVIDER_SITE_OTHER): Payer: Medicare Other | Admitting: Pharmacist

## 2013-04-03 DIAGNOSIS — I4891 Unspecified atrial fibrillation: Secondary | ICD-10-CM | POA: Diagnosis not present

## 2013-04-11 ENCOUNTER — Other Ambulatory Visit: Payer: Self-pay | Admitting: Interventional Cardiology

## 2013-04-12 DIAGNOSIS — R7989 Other specified abnormal findings of blood chemistry: Secondary | ICD-10-CM | POA: Diagnosis not present

## 2013-04-12 DIAGNOSIS — J449 Chronic obstructive pulmonary disease, unspecified: Secondary | ICD-10-CM | POA: Diagnosis not present

## 2013-04-12 DIAGNOSIS — M109 Gout, unspecified: Secondary | ICD-10-CM | POA: Diagnosis not present

## 2013-04-12 DIAGNOSIS — Z1331 Encounter for screening for depression: Secondary | ICD-10-CM | POA: Diagnosis not present

## 2013-04-12 DIAGNOSIS — I10 Essential (primary) hypertension: Secondary | ICD-10-CM | POA: Diagnosis not present

## 2013-04-12 DIAGNOSIS — Z23 Encounter for immunization: Secondary | ICD-10-CM | POA: Diagnosis not present

## 2013-04-12 DIAGNOSIS — Z0000 Encounter for general adult medical examination without abnormal findings: Secondary | ICD-10-CM | POA: Diagnosis not present

## 2013-04-12 DIAGNOSIS — E785 Hyperlipidemia, unspecified: Secondary | ICD-10-CM | POA: Diagnosis not present

## 2013-05-15 ENCOUNTER — Ambulatory Visit (INDEPENDENT_AMBULATORY_CARE_PROVIDER_SITE_OTHER): Payer: Medicare Other | Admitting: Pharmacist

## 2013-05-15 ENCOUNTER — Other Ambulatory Visit: Payer: Self-pay | Admitting: Dermatology

## 2013-05-15 DIAGNOSIS — I4891 Unspecified atrial fibrillation: Secondary | ICD-10-CM

## 2013-05-15 DIAGNOSIS — D044 Carcinoma in situ of skin of scalp and neck: Secondary | ICD-10-CM | POA: Diagnosis not present

## 2013-05-15 DIAGNOSIS — L570 Actinic keratosis: Secondary | ICD-10-CM | POA: Diagnosis not present

## 2013-05-15 DIAGNOSIS — L821 Other seborrheic keratosis: Secondary | ICD-10-CM | POA: Diagnosis not present

## 2013-05-15 DIAGNOSIS — D485 Neoplasm of uncertain behavior of skin: Secondary | ICD-10-CM | POA: Diagnosis not present

## 2013-05-15 DIAGNOSIS — L538 Other specified erythematous conditions: Secondary | ICD-10-CM | POA: Diagnosis not present

## 2013-05-15 DIAGNOSIS — Z85828 Personal history of other malignant neoplasm of skin: Secondary | ICD-10-CM | POA: Diagnosis not present

## 2013-05-15 DIAGNOSIS — D045 Carcinoma in situ of skin of trunk: Secondary | ICD-10-CM | POA: Diagnosis not present

## 2013-05-15 LAB — POCT INR: INR: 2.4

## 2013-05-23 DIAGNOSIS — Z85828 Personal history of other malignant neoplasm of skin: Secondary | ICD-10-CM | POA: Diagnosis not present

## 2013-05-23 DIAGNOSIS — D044 Carcinoma in situ of skin of scalp and neck: Secondary | ICD-10-CM | POA: Diagnosis not present

## 2013-06-26 ENCOUNTER — Ambulatory Visit (INDEPENDENT_AMBULATORY_CARE_PROVIDER_SITE_OTHER): Payer: Medicare Other | Admitting: Pharmacist

## 2013-06-26 DIAGNOSIS — I4891 Unspecified atrial fibrillation: Secondary | ICD-10-CM | POA: Diagnosis not present

## 2013-06-26 LAB — POCT INR: INR: 2.1

## 2013-07-18 DIAGNOSIS — Z85828 Personal history of other malignant neoplasm of skin: Secondary | ICD-10-CM | POA: Diagnosis not present

## 2013-07-18 DIAGNOSIS — L918 Other hypertrophic disorders of the skin: Secondary | ICD-10-CM | POA: Diagnosis not present

## 2013-08-02 DIAGNOSIS — H11009 Unspecified pterygium of unspecified eye: Secondary | ICD-10-CM | POA: Diagnosis not present

## 2013-08-02 DIAGNOSIS — H43819 Vitreous degeneration, unspecified eye: Secondary | ICD-10-CM | POA: Diagnosis not present

## 2013-08-02 DIAGNOSIS — H3530 Unspecified macular degeneration: Secondary | ICD-10-CM | POA: Diagnosis not present

## 2013-08-02 DIAGNOSIS — Z961 Presence of intraocular lens: Secondary | ICD-10-CM | POA: Diagnosis not present

## 2013-08-07 DIAGNOSIS — J329 Chronic sinusitis, unspecified: Secondary | ICD-10-CM | POA: Diagnosis not present

## 2013-08-22 ENCOUNTER — Ambulatory Visit (INDEPENDENT_AMBULATORY_CARE_PROVIDER_SITE_OTHER): Payer: Medicare Other | Admitting: Pharmacist

## 2013-08-22 DIAGNOSIS — I4891 Unspecified atrial fibrillation: Secondary | ICD-10-CM | POA: Diagnosis not present

## 2013-08-22 LAB — POCT INR: INR: 2.9

## 2013-09-30 ENCOUNTER — Other Ambulatory Visit: Payer: Self-pay | Admitting: Interventional Cardiology

## 2013-10-03 ENCOUNTER — Ambulatory Visit (INDEPENDENT_AMBULATORY_CARE_PROVIDER_SITE_OTHER): Payer: Medicare Other | Admitting: Pharmacist Clinician (PhC)/ Clinical Pharmacy Specialist

## 2013-10-03 DIAGNOSIS — I4891 Unspecified atrial fibrillation: Secondary | ICD-10-CM

## 2013-10-03 LAB — POCT INR: INR: 2.8

## 2013-10-09 ENCOUNTER — Other Ambulatory Visit: Payer: Self-pay | Admitting: Interventional Cardiology

## 2013-10-12 DIAGNOSIS — E785 Hyperlipidemia, unspecified: Secondary | ICD-10-CM | POA: Diagnosis not present

## 2013-10-12 DIAGNOSIS — Z23 Encounter for immunization: Secondary | ICD-10-CM | POA: Diagnosis not present

## 2013-10-12 DIAGNOSIS — I10 Essential (primary) hypertension: Secondary | ICD-10-CM | POA: Diagnosis not present

## 2013-10-12 DIAGNOSIS — R269 Unspecified abnormalities of gait and mobility: Secondary | ICD-10-CM | POA: Diagnosis not present

## 2013-10-12 DIAGNOSIS — N183 Chronic kidney disease, stage 3 unspecified: Secondary | ICD-10-CM | POA: Diagnosis not present

## 2013-10-12 DIAGNOSIS — M109 Gout, unspecified: Secondary | ICD-10-CM | POA: Diagnosis not present

## 2013-11-09 DIAGNOSIS — L570 Actinic keratosis: Secondary | ICD-10-CM | POA: Diagnosis not present

## 2013-11-09 DIAGNOSIS — Z85828 Personal history of other malignant neoplasm of skin: Secondary | ICD-10-CM | POA: Diagnosis not present

## 2013-11-14 ENCOUNTER — Ambulatory Visit (INDEPENDENT_AMBULATORY_CARE_PROVIDER_SITE_OTHER): Payer: Medicare Other | Admitting: *Deleted

## 2013-11-14 DIAGNOSIS — I4891 Unspecified atrial fibrillation: Secondary | ICD-10-CM | POA: Diagnosis not present

## 2013-11-14 LAB — POCT INR: INR: 3.8

## 2013-12-04 ENCOUNTER — Ambulatory Visit (INDEPENDENT_AMBULATORY_CARE_PROVIDER_SITE_OTHER): Payer: Medicare Other | Admitting: *Deleted

## 2013-12-04 DIAGNOSIS — I4891 Unspecified atrial fibrillation: Secondary | ICD-10-CM | POA: Diagnosis not present

## 2013-12-04 LAB — POCT INR: INR: 2.2

## 2013-12-07 ENCOUNTER — Encounter: Payer: Self-pay | Admitting: *Deleted

## 2013-12-28 ENCOUNTER — Other Ambulatory Visit: Payer: Self-pay | Admitting: Interventional Cardiology

## 2014-01-01 ENCOUNTER — Ambulatory Visit (INDEPENDENT_AMBULATORY_CARE_PROVIDER_SITE_OTHER): Payer: Medicare Other | Admitting: Pharmacist

## 2014-01-01 DIAGNOSIS — I4891 Unspecified atrial fibrillation: Secondary | ICD-10-CM

## 2014-01-01 LAB — POCT INR: INR: 2.3

## 2014-01-03 ENCOUNTER — Other Ambulatory Visit: Payer: Self-pay | Admitting: Interventional Cardiology

## 2014-02-09 ENCOUNTER — Other Ambulatory Visit: Payer: Self-pay | Admitting: Cardiology

## 2014-02-12 ENCOUNTER — Ambulatory Visit (INDEPENDENT_AMBULATORY_CARE_PROVIDER_SITE_OTHER): Payer: Medicare Other | Admitting: *Deleted

## 2014-02-12 ENCOUNTER — Telehealth: Payer: Self-pay | Admitting: Pharmacist

## 2014-02-12 DIAGNOSIS — I4891 Unspecified atrial fibrillation: Secondary | ICD-10-CM | POA: Diagnosis not present

## 2014-02-12 LAB — POCT INR: INR: 3.2

## 2014-02-12 NOTE — Telephone Encounter (Signed)
Patient on both aspirin and warfarin currently.  No history of CABG, CAD, MI, PCI, etc.  Patient is 80 years old.  No history of bleeding.  He is due to see you in next few weeks for his annual.  Do you want patient to d/c aspirin, or continue both warfarin and aspirin.

## 2014-02-13 NOTE — Telephone Encounter (Signed)
Yes

## 2014-02-14 NOTE — Telephone Encounter (Signed)
Confirmed with Dr. Tamala Julian that he wanted patient to discontinue aspirin. Patient notified, and med list updated.

## 2014-03-06 ENCOUNTER — Encounter: Payer: Self-pay | Admitting: Interventional Cardiology

## 2014-03-06 ENCOUNTER — Ambulatory Visit (INDEPENDENT_AMBULATORY_CARE_PROVIDER_SITE_OTHER): Payer: Medicare Other | Admitting: Interventional Cardiology

## 2014-03-06 ENCOUNTER — Ambulatory Visit (INDEPENDENT_AMBULATORY_CARE_PROVIDER_SITE_OTHER): Payer: Medicare Other | Admitting: Pharmacist

## 2014-03-06 VITALS — BP 144/80 | HR 78 | Ht 72.0 in | Wt 257.0 lb

## 2014-03-06 DIAGNOSIS — I10 Essential (primary) hypertension: Secondary | ICD-10-CM

## 2014-03-06 DIAGNOSIS — Z7901 Long term (current) use of anticoagulants: Secondary | ICD-10-CM | POA: Diagnosis not present

## 2014-03-06 DIAGNOSIS — I4891 Unspecified atrial fibrillation: Secondary | ICD-10-CM | POA: Diagnosis not present

## 2014-03-06 DIAGNOSIS — IMO0001 Reserved for inherently not codable concepts without codable children: Secondary | ICD-10-CM

## 2014-03-06 DIAGNOSIS — I482 Chronic atrial fibrillation, unspecified: Secondary | ICD-10-CM | POA: Insufficient documentation

## 2014-03-06 DIAGNOSIS — J449 Chronic obstructive pulmonary disease, unspecified: Secondary | ICD-10-CM | POA: Diagnosis not present

## 2014-03-06 LAB — POCT INR: INR: 3.6

## 2014-03-06 NOTE — Patient Instructions (Signed)
Your physician recommends that you continue on your current medications as directed. Please refer to the Current Medication list given to you today.  Your physician wants you to follow-up in: 1 year. You will receive a reminder letter in the mail two months in advance. If you don't receive a letter, please call our office to schedule the follow-up appointment.  

## 2014-03-06 NOTE — Progress Notes (Addendum)
Patient ID: Vincent Foster, male   DOB: 01-May-1924, 80 y.o.   MRN: [id number]    [address], [place], [place]  [address] Phone: [phone] Fax:  [phone]  Date:  03/06/2014   ID:  Vincent Foster, DOB 08/12/1923, MRN [id number]  PCP:  Irven Shelling, MD   ASSESSMENT:  1. Chronic atrial fibrillation with rate control  2. Chronic diastolic heart failure  3. Chronic anticoagulation therapy  4. COPD  PLAN:  1. Current status is stable 2. Call of dyspnea or angina   SUBJECTIVE: Vincent Foster is a 80 y.o. male who has had no significant change in the interval since I last saw him. He denies lower extremity swelling. Chronic dyspnea, dizziness, and imbalance. He is unable to exercise. There been no transient neurological complaints. No blood in his urine or stool. He denies neurological complaints that are new.   Wt Readings from Last 3 Encounters:  03/06/14 257 lb (116.574 kg)  04/26/12 247 lb (112.038 kg)     Past Medical History  Diagnosis Date  . Allergy   . Glaucoma   . Atrial fibrillation   . Hyperlipidemia   . Hypertension   . Venous stasis     Lasix PRN.  . IFG (impaired fasting glucose)   . CKD (chronic kidney disease), stage III   . Adenocarcinoma of prostate   . History of vertigo   . Basal cell carcinoma   . Gout     Current Outpatient Prescriptions  Medication Sig Dispense Refill  . albuterol (PROVENTIL HFA;VENTOLIN HFA) 108 (90 BASE) MCG/ACT inhaler Inhale into the lungs every 6 (six) hours as needed for wheezing or shortness of breath.      . allopurinol (ZYLOPRIM) 100 MG tablet Take 200 mg by mouth daily.      . cetirizine (ZYRTEC) 10 MG tablet Take 10 mg by mouth daily.      . colchicine 0.6 MG tablet Take 0.6 mg by mouth daily. Take two tablets every morning for gout.      . furosemide (LASIX) 20 MG tablet TAKE 3 TABLETS EVERY AM.  270 tablet  1  . losartan (COZAAR) 50 MG tablet TAKE 1 TABLET ONCE DAILY.  90 tablet   0  . metoprolol (LOPRESSOR) 50 MG tablet TAKE 1 TABLET TWICE DAILY.  180 tablet  0  . montelukast (SINGULAIR) 10 MG tablet Take 10 mg by mouth as needed.       . potassium chloride (K-DUR) 10 MEQ tablet Take 10 mEq by mouth 2 (two) times daily.      . simvastatin (ZOCOR) 40 MG tablet Take 40 mg by mouth every evening.      . warfarin (COUMADIN) 6 MG tablet TAKE 1 TABLET DAILY OR AS DIRECTED.  90 tablet  0   No current facility-administered medications for this visit.    Allergies:    Allergies  Allergen Reactions  . Tetanus Toxoids Other (See Comments)    He ran a real high fever    Social History:  The patient  reports that he has quit smoking. He does not have any smokeless tobacco history on file. He reports that he does not use illicit drugs.   ROS:  Please see the history of present illness.   No weight loss. Appetite is been stable.   All other systems reviewed and negative.   OBJECTIVE: VS:  BP 144/80  Pulse 78  Ht 6' (1.829 m)  Wt 257 lb (  116.574 kg)  BMI 34.85 kg/m2 Well nourished, well developed, in no acute distress, obese and elderly HEENT: normal Neck: JVD flat. Carotid bruit absent  Cardiac:  normal S1, S2; IIRR; no murmur Lungs:  clear to auscultation bilaterally, no wheezing, rhonchi or rales Abd: soft, nontender, no hepatomegaly Ext: Edema absent. Pulses 2+ bilateral Skin: warm and dry Neuro:  CNs 2-12 intact, no focal abnormalities noted  EKG:  Atrial fibrillation with controlled rate at 70 beats per minute. Right bundle branch block. T wave abnormality in inferior leads. Possible septal infarct.     Signed, Illene Labrador III, MD 03/06/2014 3:05 PM

## 2014-03-15 ENCOUNTER — Other Ambulatory Visit: Payer: Self-pay | Admitting: Dermatology

## 2014-03-15 DIAGNOSIS — L821 Other seborrheic keratosis: Secondary | ICD-10-CM | POA: Diagnosis not present

## 2014-03-15 DIAGNOSIS — L738 Other specified follicular disorders: Secondary | ICD-10-CM | POA: Diagnosis not present

## 2014-03-15 DIAGNOSIS — D0439 Carcinoma in situ of skin of other parts of face: Secondary | ICD-10-CM | POA: Diagnosis not present

## 2014-03-15 DIAGNOSIS — L678 Other hair color and hair shaft abnormalities: Secondary | ICD-10-CM | POA: Diagnosis not present

## 2014-03-15 DIAGNOSIS — Z85828 Personal history of other malignant neoplasm of skin: Secondary | ICD-10-CM | POA: Diagnosis not present

## 2014-03-15 DIAGNOSIS — L570 Actinic keratosis: Secondary | ICD-10-CM | POA: Diagnosis not present

## 2014-03-15 DIAGNOSIS — D0430 Carcinoma in situ of skin of unspecified part of face: Secondary | ICD-10-CM | POA: Diagnosis not present

## 2014-03-15 DIAGNOSIS — D485 Neoplasm of uncertain behavior of skin: Secondary | ICD-10-CM | POA: Diagnosis not present

## 2014-03-22 DIAGNOSIS — Z23 Encounter for immunization: Secondary | ICD-10-CM | POA: Diagnosis not present

## 2014-03-27 ENCOUNTER — Other Ambulatory Visit: Payer: Self-pay | Admitting: Interventional Cardiology

## 2014-04-03 ENCOUNTER — Other Ambulatory Visit: Payer: Self-pay | Admitting: Interventional Cardiology

## 2014-04-11 DIAGNOSIS — H04123 Dry eye syndrome of bilateral lacrimal glands: Secondary | ICD-10-CM | POA: Diagnosis not present

## 2014-04-11 DIAGNOSIS — H01002 Unspecified blepharitis right lower eyelid: Secondary | ICD-10-CM | POA: Diagnosis not present

## 2014-04-11 DIAGNOSIS — H01001 Unspecified blepharitis right upper eyelid: Secondary | ICD-10-CM | POA: Diagnosis not present

## 2014-04-11 DIAGNOSIS — H04223 Epiphora due to insufficient drainage, bilateral lacrimal glands: Secondary | ICD-10-CM | POA: Diagnosis not present

## 2014-04-24 ENCOUNTER — Ambulatory Visit (INDEPENDENT_AMBULATORY_CARE_PROVIDER_SITE_OTHER): Payer: Medicare Other | Admitting: Pharmacist Clinician (PhC)/ Clinical Pharmacy Specialist

## 2014-04-24 DIAGNOSIS — I482 Chronic atrial fibrillation, unspecified: Secondary | ICD-10-CM

## 2014-04-24 DIAGNOSIS — Z7901 Long term (current) use of anticoagulants: Secondary | ICD-10-CM

## 2014-04-24 LAB — POCT INR: INR: 3.5

## 2014-05-01 DIAGNOSIS — I129 Hypertensive chronic kidney disease with stage 1 through stage 4 chronic kidney disease, or unspecified chronic kidney disease: Secondary | ICD-10-CM | POA: Diagnosis not present

## 2014-05-01 DIAGNOSIS — N183 Chronic kidney disease, stage 3 (moderate): Secondary | ICD-10-CM | POA: Diagnosis not present

## 2014-05-01 DIAGNOSIS — I5030 Unspecified diastolic (congestive) heart failure: Secondary | ICD-10-CM | POA: Diagnosis not present

## 2014-05-01 DIAGNOSIS — Z1389 Encounter for screening for other disorder: Secondary | ICD-10-CM | POA: Diagnosis not present

## 2014-05-01 DIAGNOSIS — M109 Gout, unspecified: Secondary | ICD-10-CM | POA: Diagnosis not present

## 2014-05-01 DIAGNOSIS — I4891 Unspecified atrial fibrillation: Secondary | ICD-10-CM | POA: Diagnosis not present

## 2014-05-04 DIAGNOSIS — J40 Bronchitis, not specified as acute or chronic: Secondary | ICD-10-CM | POA: Diagnosis not present

## 2014-05-21 ENCOUNTER — Ambulatory Visit (INDEPENDENT_AMBULATORY_CARE_PROVIDER_SITE_OTHER): Payer: Medicare Other

## 2014-05-21 DIAGNOSIS — I482 Chronic atrial fibrillation, unspecified: Secondary | ICD-10-CM

## 2014-05-21 DIAGNOSIS — Z7901 Long term (current) use of anticoagulants: Secondary | ICD-10-CM

## 2014-05-21 DIAGNOSIS — I4891 Unspecified atrial fibrillation: Secondary | ICD-10-CM

## 2014-05-21 LAB — POCT INR: INR: 2.4

## 2014-05-21 MED ORDER — WARFARIN SODIUM 3 MG PO TABS: ORAL_TABLET | ORAL | Status: DC

## 2014-06-11 ENCOUNTER — Ambulatory Visit (INDEPENDENT_AMBULATORY_CARE_PROVIDER_SITE_OTHER): Payer: Medicare Other | Admitting: Pharmacist

## 2014-06-11 DIAGNOSIS — Z7901 Long term (current) use of anticoagulants: Secondary | ICD-10-CM

## 2014-06-11 DIAGNOSIS — I482 Chronic atrial fibrillation, unspecified: Secondary | ICD-10-CM

## 2014-06-11 LAB — POCT INR: INR: 3.6

## 2014-06-25 ENCOUNTER — Ambulatory Visit (INDEPENDENT_AMBULATORY_CARE_PROVIDER_SITE_OTHER): Payer: Medicare Other | Admitting: *Deleted

## 2014-06-25 DIAGNOSIS — I482 Chronic atrial fibrillation, unspecified: Secondary | ICD-10-CM

## 2014-06-25 DIAGNOSIS — I4891 Unspecified atrial fibrillation: Secondary | ICD-10-CM

## 2014-06-25 DIAGNOSIS — Z7901 Long term (current) use of anticoagulants: Secondary | ICD-10-CM | POA: Diagnosis not present

## 2014-06-25 LAB — POCT INR: INR: 2.6

## 2014-06-27 DIAGNOSIS — J040 Acute laryngitis: Secondary | ICD-10-CM | POA: Diagnosis not present

## 2014-07-23 ENCOUNTER — Ambulatory Visit (INDEPENDENT_AMBULATORY_CARE_PROVIDER_SITE_OTHER): Payer: Medicare Other

## 2014-07-23 DIAGNOSIS — Z7901 Long term (current) use of anticoagulants: Secondary | ICD-10-CM

## 2014-07-23 DIAGNOSIS — I4891 Unspecified atrial fibrillation: Secondary | ICD-10-CM | POA: Diagnosis not present

## 2014-07-23 DIAGNOSIS — I482 Chronic atrial fibrillation, unspecified: Secondary | ICD-10-CM

## 2014-07-23 LAB — POCT INR: INR: 2.7

## 2014-08-27 ENCOUNTER — Ambulatory Visit (INDEPENDENT_AMBULATORY_CARE_PROVIDER_SITE_OTHER): Payer: Medicare Other | Admitting: Pharmacist

## 2014-08-27 DIAGNOSIS — I4891 Unspecified atrial fibrillation: Secondary | ICD-10-CM

## 2014-08-27 DIAGNOSIS — I482 Chronic atrial fibrillation, unspecified: Secondary | ICD-10-CM

## 2014-08-27 DIAGNOSIS — Z7901 Long term (current) use of anticoagulants: Secondary | ICD-10-CM

## 2014-08-27 LAB — POCT INR: INR: 2.6

## 2014-09-11 DIAGNOSIS — J309 Allergic rhinitis, unspecified: Secondary | ICD-10-CM | POA: Diagnosis not present

## 2014-09-27 ENCOUNTER — Other Ambulatory Visit: Payer: Self-pay | Admitting: Dermatology

## 2014-09-27 DIAGNOSIS — L570 Actinic keratosis: Secondary | ICD-10-CM | POA: Diagnosis not present

## 2014-09-27 DIAGNOSIS — D485 Neoplasm of uncertain behavior of skin: Secondary | ICD-10-CM | POA: Diagnosis not present

## 2014-09-27 DIAGNOSIS — L821 Other seborrheic keratosis: Secondary | ICD-10-CM | POA: Diagnosis not present

## 2014-09-27 DIAGNOSIS — C4441 Basal cell carcinoma of skin of scalp and neck: Secondary | ICD-10-CM | POA: Diagnosis not present

## 2014-09-27 DIAGNOSIS — D0462 Carcinoma in situ of skin of left upper limb, including shoulder: Secondary | ICD-10-CM | POA: Diagnosis not present

## 2014-09-27 DIAGNOSIS — Z85828 Personal history of other malignant neoplasm of skin: Secondary | ICD-10-CM | POA: Diagnosis not present

## 2014-10-08 ENCOUNTER — Ambulatory Visit (INDEPENDENT_AMBULATORY_CARE_PROVIDER_SITE_OTHER): Payer: Medicare Other | Admitting: *Deleted

## 2014-10-08 DIAGNOSIS — I4891 Unspecified atrial fibrillation: Secondary | ICD-10-CM | POA: Diagnosis not present

## 2014-10-08 DIAGNOSIS — I482 Chronic atrial fibrillation, unspecified: Secondary | ICD-10-CM

## 2014-10-08 DIAGNOSIS — Z5181 Encounter for therapeutic drug level monitoring: Secondary | ICD-10-CM | POA: Diagnosis not present

## 2014-10-08 DIAGNOSIS — Z7901 Long term (current) use of anticoagulants: Secondary | ICD-10-CM

## 2014-10-08 LAB — POCT INR: INR: 3.3

## 2014-11-01 DIAGNOSIS — M109 Gout, unspecified: Secondary | ICD-10-CM | POA: Diagnosis not present

## 2014-11-01 DIAGNOSIS — J449 Chronic obstructive pulmonary disease, unspecified: Secondary | ICD-10-CM | POA: Diagnosis not present

## 2014-11-01 DIAGNOSIS — R7301 Impaired fasting glucose: Secondary | ICD-10-CM | POA: Diagnosis not present

## 2014-11-01 DIAGNOSIS — J309 Allergic rhinitis, unspecified: Secondary | ICD-10-CM | POA: Diagnosis not present

## 2014-11-01 DIAGNOSIS — I129 Hypertensive chronic kidney disease with stage 1 through stage 4 chronic kidney disease, or unspecified chronic kidney disease: Secondary | ICD-10-CM | POA: Diagnosis not present

## 2014-11-01 DIAGNOSIS — I5030 Unspecified diastolic (congestive) heart failure: Secondary | ICD-10-CM | POA: Diagnosis not present

## 2014-11-01 DIAGNOSIS — I4891 Unspecified atrial fibrillation: Secondary | ICD-10-CM | POA: Diagnosis not present

## 2014-11-01 DIAGNOSIS — N183 Chronic kidney disease, stage 3 (moderate): Secondary | ICD-10-CM | POA: Diagnosis not present

## 2014-11-01 DIAGNOSIS — E780 Pure hypercholesterolemia: Secondary | ICD-10-CM | POA: Diagnosis not present

## 2014-11-20 ENCOUNTER — Ambulatory Visit (INDEPENDENT_AMBULATORY_CARE_PROVIDER_SITE_OTHER): Payer: Medicare Other | Admitting: *Deleted

## 2014-11-20 DIAGNOSIS — Z5181 Encounter for therapeutic drug level monitoring: Secondary | ICD-10-CM | POA: Diagnosis not present

## 2014-11-20 DIAGNOSIS — I482 Chronic atrial fibrillation, unspecified: Secondary | ICD-10-CM

## 2014-11-20 DIAGNOSIS — Z7901 Long term (current) use of anticoagulants: Secondary | ICD-10-CM | POA: Diagnosis not present

## 2014-11-20 DIAGNOSIS — I4891 Unspecified atrial fibrillation: Secondary | ICD-10-CM | POA: Diagnosis not present

## 2014-11-20 LAB — POCT INR: INR: 2.9

## 2014-11-20 MED ORDER — WARFARIN SODIUM 3 MG PO TABS: ORAL_TABLET | ORAL | Status: DC

## 2014-12-25 ENCOUNTER — Other Ambulatory Visit: Payer: Self-pay | Admitting: Interventional Cardiology

## 2015-01-01 ENCOUNTER — Ambulatory Visit (INDEPENDENT_AMBULATORY_CARE_PROVIDER_SITE_OTHER): Payer: Medicare Other | Admitting: Pharmacist Clinician (PhC)/ Clinical Pharmacy Specialist

## 2015-01-01 DIAGNOSIS — Z7901 Long term (current) use of anticoagulants: Secondary | ICD-10-CM | POA: Diagnosis not present

## 2015-01-01 DIAGNOSIS — I4891 Unspecified atrial fibrillation: Secondary | ICD-10-CM

## 2015-01-01 DIAGNOSIS — I482 Chronic atrial fibrillation, unspecified: Secondary | ICD-10-CM

## 2015-01-01 DIAGNOSIS — Z5181 Encounter for therapeutic drug level monitoring: Secondary | ICD-10-CM

## 2015-01-01 LAB — POCT INR: INR: 1.7

## 2015-01-21 ENCOUNTER — Ambulatory Visit (INDEPENDENT_AMBULATORY_CARE_PROVIDER_SITE_OTHER): Payer: Medicare Other | Admitting: Pharmacist

## 2015-01-21 DIAGNOSIS — Z5181 Encounter for therapeutic drug level monitoring: Secondary | ICD-10-CM | POA: Diagnosis not present

## 2015-01-21 DIAGNOSIS — I482 Chronic atrial fibrillation, unspecified: Secondary | ICD-10-CM

## 2015-01-21 DIAGNOSIS — I4891 Unspecified atrial fibrillation: Secondary | ICD-10-CM

## 2015-01-21 DIAGNOSIS — Z7901 Long term (current) use of anticoagulants: Secondary | ICD-10-CM | POA: Diagnosis not present

## 2015-01-21 LAB — POCT INR: INR: 1.7

## 2015-01-23 DIAGNOSIS — R319 Hematuria, unspecified: Secondary | ICD-10-CM | POA: Diagnosis not present

## 2015-01-29 DIAGNOSIS — D485 Neoplasm of uncertain behavior of skin: Secondary | ICD-10-CM | POA: Diagnosis not present

## 2015-01-29 DIAGNOSIS — L821 Other seborrheic keratosis: Secondary | ICD-10-CM | POA: Diagnosis not present

## 2015-01-29 DIAGNOSIS — L570 Actinic keratosis: Secondary | ICD-10-CM | POA: Diagnosis not present

## 2015-01-29 DIAGNOSIS — Z85828 Personal history of other malignant neoplasm of skin: Secondary | ICD-10-CM | POA: Diagnosis not present

## 2015-02-14 ENCOUNTER — Ambulatory Visit (INDEPENDENT_AMBULATORY_CARE_PROVIDER_SITE_OTHER): Payer: Medicare Other | Admitting: Pharmacist

## 2015-02-14 DIAGNOSIS — Z5181 Encounter for therapeutic drug level monitoring: Secondary | ICD-10-CM | POA: Diagnosis not present

## 2015-02-14 DIAGNOSIS — Z7901 Long term (current) use of anticoagulants: Secondary | ICD-10-CM

## 2015-02-14 DIAGNOSIS — I4891 Unspecified atrial fibrillation: Secondary | ICD-10-CM

## 2015-02-14 DIAGNOSIS — I482 Chronic atrial fibrillation, unspecified: Secondary | ICD-10-CM

## 2015-02-14 LAB — POCT INR: INR: 3.3

## 2015-03-07 ENCOUNTER — Ambulatory Visit (INDEPENDENT_AMBULATORY_CARE_PROVIDER_SITE_OTHER): Payer: Medicare Other

## 2015-03-07 DIAGNOSIS — I482 Chronic atrial fibrillation, unspecified: Secondary | ICD-10-CM

## 2015-03-07 DIAGNOSIS — I4891 Unspecified atrial fibrillation: Secondary | ICD-10-CM

## 2015-03-07 DIAGNOSIS — Z7901 Long term (current) use of anticoagulants: Secondary | ICD-10-CM

## 2015-03-07 DIAGNOSIS — Z5181 Encounter for therapeutic drug level monitoring: Secondary | ICD-10-CM

## 2015-03-07 LAB — POCT INR: INR: 3.2

## 2015-03-22 ENCOUNTER — Encounter: Payer: Self-pay | Admitting: Interventional Cardiology

## 2015-03-22 ENCOUNTER — Ambulatory Visit (INDEPENDENT_AMBULATORY_CARE_PROVIDER_SITE_OTHER): Payer: Medicare Other

## 2015-03-22 ENCOUNTER — Ambulatory Visit (INDEPENDENT_AMBULATORY_CARE_PROVIDER_SITE_OTHER): Payer: Medicare Other | Admitting: Interventional Cardiology

## 2015-03-22 VITALS — BP 132/78 | HR 82 | Ht 72.0 in | Wt 247.4 lb

## 2015-03-22 DIAGNOSIS — Z7901 Long term (current) use of anticoagulants: Secondary | ICD-10-CM | POA: Diagnosis not present

## 2015-03-22 DIAGNOSIS — I482 Chronic atrial fibrillation, unspecified: Secondary | ICD-10-CM

## 2015-03-22 DIAGNOSIS — Z5181 Encounter for therapeutic drug level monitoring: Secondary | ICD-10-CM

## 2015-03-22 DIAGNOSIS — I10 Essential (primary) hypertension: Secondary | ICD-10-CM

## 2015-03-22 DIAGNOSIS — I4891 Unspecified atrial fibrillation: Secondary | ICD-10-CM

## 2015-03-22 DIAGNOSIS — IMO0001 Reserved for inherently not codable concepts without codable children: Secondary | ICD-10-CM

## 2015-03-22 DIAGNOSIS — J449 Chronic obstructive pulmonary disease, unspecified: Secondary | ICD-10-CM

## 2015-03-22 LAB — POCT INR: INR: 2.2

## 2015-03-22 MED ORDER — METOPROLOL TARTRATE 50 MG PO TABS: 50.0000 mg | ORAL_TABLET | Freq: Two times a day (BID) | ORAL | Status: DC

## 2015-03-22 NOTE — Progress Notes (Signed)
Cardiology Office Note   Date:  03/22/2015   ID:  Vincent Foster, DOB 07/16/1923, MRN [id number]  PCP:  Irven Shelling, MD  Cardiologist:  Sinclair Grooms, MD   Chief Complaint  Patient presents with  . Atrial Fibrillation      History of Present Illness: Vincent Foster is a 81 y.o. male who presents for who has COPD, history of hypertension, chronic atrial fibrillation, and chronic anticoagulation therapy on Coumadin.  Vincent Foster has done well. He is disappointed that his wife was unable to see me when she developed atrial fibrillation. He has not had chest pain, orthopnea, PND, or syncope. There been no bleeding complications.    Past Medical History  Diagnosis Date  . Allergy   . Glaucoma   . Atrial fibrillation   . Hyperlipidemia   . Hypertension   . Venous stasis     Lasix PRN.  . IFG (impaired fasting glucose)   . CKD (chronic kidney disease), stage III   . Adenocarcinoma of prostate   . History of vertigo   . Basal cell carcinoma   . Gout     Past Surgical History  Procedure Laterality Date  . Appendectomy    . Prostate surgery      Had gold seed placement  . Tonsilectomy, adenoidectomy, bilateral myringotomy and tubes    . Colon surgery       Current Outpatient Prescriptions  Medication Sig Dispense Refill  . albuterol (PROVENTIL HFA;VENTOLIN HFA) 108 (90 BASE) MCG/ACT inhaler Inhale into the lungs every 6 (six) hours as needed for wheezing or shortness of breath.    . allopurinol (ZYLOPRIM) 100 MG tablet Take 200 mg by mouth daily.    . cetirizine (ZYRTEC) 10 MG tablet Take 10 mg by mouth daily.    . colchicine 0.6 MG tablet Take 0.6 mg by mouth daily. Take two tablets every morning for gout.    . furosemide (LASIX) 20 MG tablet TAKE 3 TABLETS EVERY AM. 270 tablet 3  . losartan (COZAAR) 50 MG tablet TAKE 1 TABLET ONCE DAILY. 90 tablet 3  . metoprolol (LOPRESSOR) 50 MG tablet TAKE 1 TABLET TWICE DAILY. 180 tablet 0  . montelukast  (SINGULAIR) 10 MG tablet Take 10 mg by mouth as needed.     . potassium chloride (K-DUR) 10 MEQ tablet Take 10 mEq by mouth 2 (two) times daily.    . simvastatin (ZOCOR) 40 MG tablet Take 40 mg by mouth every evening.    . warfarin (COUMADIN) 3 MG tablet Take as directed by the [facility]. 90 tablet 0   No current facility-administered medications for this visit.    Allergies:   Tetanus toxoids    Social History:  The patient  reports that he has quit smoking. He has quit using smokeless tobacco. His smokeless tobacco use included Chew. He reports that he does not use illicit drugs.   Family History:  The patient's family history includes Colon cancer in his sister; Dementia in his sister; Healthy in his mother; Pneumonia in his father.    ROS:  Please see the history of present illness.   Otherwise, review of systems are positive for has continued upper respiratory congestion, diarrhea, constipation, and has been on prednisone several times this year..   All other systems are reviewed and negative.    PHYSICAL EXAM: VS:  BP 132/78 mmHg  Pulse 82  Ht 6' (1.829 m)  Wt 112.22 kg (247 lb 6.4 oz)  BMI  33.55 kg/m2 , BMI Body mass index is 33.55 kg/(m^2). GEN: Well nourished, well developed, in no acute distress HEENT: normal Neck: no JVD, carotid bruits, or masses Cardiac: IIRR.  There is no murmur, rub, or gallop. There is no edema. Respiratory:  clear to auscultation bilaterally, normal work of breathing. GI: soft, nontender, nondistended, + BS MS: no deformity or atrophy Skin: warm and dry, no rash Neuro:  Strength and sensation are intact Psych: euthymic mood, full affect   EKG:  EKG is ordered today. The ekg reveals atrial fibrillation with right bundle branch block and good rate control   Recent Labs: No results found for requested labs within last 365 days.    Lipid Panel No results found for: CHOL, TRIG, HDL, CHOLHDL, VLDL, LDLCALC, LDLDIRECT    Wt Readings  from Last 3 Encounters:  03/22/15 112.22 kg (247 lb 6.4 oz)  03/06/14 116.574 kg (257 lb)  04/26/12 112.038 kg (247 lb)      Other studies Reviewed: Additional studies/ records that were reviewed today include: none.    ASSESSMENT AND PLAN:  1. Chronic atrial fibrillation Controlled rate  2. Essential hypertension Good blood pressure control on today's measurement  3. Long-term (current) use of anticoagulants Following Coumadin clinic  4. COPD bronchitis Chronic respiratory complaints    Current medicines are reviewed at length with the patient today.  The patient has the following concerns regarding medicines: refill of metoprolol tartrate.  The following changes/actions have been instituted:    Continue metoprolol at the current dose  Encouraged aerobic activity, as much as is safe  Labs/ tests ordered today include:  No orders of the defined types were placed in this encounter.     Disposition:   FU with HS in 1 year  Signed, Sinclair Grooms, MD  03/22/2015 11:44 AM    [address], [place], [place]  [address] Phone: [phone]; Fax: [phone]

## 2015-03-22 NOTE — Patient Instructions (Signed)
Medication Instructions:  Your physician recommends that you continue on your current medications as directed. Please refer to the Current Medication list given to you today.   Labwork: None ordered  Testing/Procedures: None ordered  Follow-Up: Your physician wants you to follow-up in: 1 year with Dr.Smith You will receive a reminder letter in the mail two months in advance. If you don't receive a letter, please call our office to schedule the follow-up appointment.   Any Other Special Instructions Will Be Listed Below (If Applicable).   

## 2015-03-29 ENCOUNTER — Other Ambulatory Visit: Payer: Self-pay | Admitting: Interventional Cardiology

## 2015-04-10 DIAGNOSIS — Z0100 Encounter for examination of eyes and vision without abnormal findings: Secondary | ICD-10-CM | POA: Diagnosis not present

## 2015-04-10 DIAGNOSIS — H16103 Unspecified superficial keratitis, bilateral: Secondary | ICD-10-CM | POA: Diagnosis not present

## 2015-04-10 DIAGNOSIS — H353131 Nonexudative age-related macular degeneration, bilateral, early dry stage: Secondary | ICD-10-CM | POA: Diagnosis not present

## 2015-04-10 DIAGNOSIS — H04123 Dry eye syndrome of bilateral lacrimal glands: Secondary | ICD-10-CM | POA: Diagnosis not present

## 2015-04-18 DIAGNOSIS — H2640 Unspecified secondary cataract: Secondary | ICD-10-CM | POA: Diagnosis not present

## 2015-04-18 DIAGNOSIS — H26492 Other secondary cataract, left eye: Secondary | ICD-10-CM | POA: Diagnosis not present

## 2015-04-19 ENCOUNTER — Ambulatory Visit (INDEPENDENT_AMBULATORY_CARE_PROVIDER_SITE_OTHER): Payer: Medicare Other | Admitting: Pharmacist

## 2015-04-19 DIAGNOSIS — I482 Chronic atrial fibrillation, unspecified: Secondary | ICD-10-CM

## 2015-04-19 DIAGNOSIS — Z7901 Long term (current) use of anticoagulants: Secondary | ICD-10-CM

## 2015-04-19 LAB — POCT INR: INR: 1.8

## 2015-04-19 MED ORDER — WARFARIN SODIUM 3 MG PO TABS: ORAL_TABLET | ORAL | Status: DC

## 2015-05-03 DIAGNOSIS — Z23 Encounter for immunization: Secondary | ICD-10-CM | POA: Diagnosis not present

## 2015-05-03 DIAGNOSIS — I5030 Unspecified diastolic (congestive) heart failure: Secondary | ICD-10-CM | POA: Diagnosis not present

## 2015-05-03 DIAGNOSIS — N183 Chronic kidney disease, stage 3 (moderate): Secondary | ICD-10-CM | POA: Diagnosis not present

## 2015-05-03 DIAGNOSIS — Z0000 Encounter for general adult medical examination without abnormal findings: Secondary | ICD-10-CM | POA: Diagnosis not present

## 2015-05-03 DIAGNOSIS — Z1389 Encounter for screening for other disorder: Secondary | ICD-10-CM | POA: Diagnosis not present

## 2015-05-03 DIAGNOSIS — I129 Hypertensive chronic kidney disease with stage 1 through stage 4 chronic kidney disease, or unspecified chronic kidney disease: Secondary | ICD-10-CM | POA: Diagnosis not present

## 2015-05-03 DIAGNOSIS — R7301 Impaired fasting glucose: Secondary | ICD-10-CM | POA: Diagnosis not present

## 2015-05-10 ENCOUNTER — Ambulatory Visit (INDEPENDENT_AMBULATORY_CARE_PROVIDER_SITE_OTHER): Payer: Medicare Other | Admitting: Pharmacist

## 2015-05-10 DIAGNOSIS — I482 Chronic atrial fibrillation, unspecified: Secondary | ICD-10-CM

## 2015-05-10 DIAGNOSIS — Z7901 Long term (current) use of anticoagulants: Secondary | ICD-10-CM | POA: Diagnosis not present

## 2015-05-10 LAB — POCT INR: INR: 2.2

## 2015-05-28 DIAGNOSIS — D1801 Hemangioma of skin and subcutaneous tissue: Secondary | ICD-10-CM | POA: Diagnosis not present

## 2015-05-28 DIAGNOSIS — Z85828 Personal history of other malignant neoplasm of skin: Secondary | ICD-10-CM | POA: Diagnosis not present

## 2015-05-28 DIAGNOSIS — L821 Other seborrheic keratosis: Secondary | ICD-10-CM | POA: Diagnosis not present

## 2015-05-28 DIAGNOSIS — C44329 Squamous cell carcinoma of skin of other parts of face: Secondary | ICD-10-CM | POA: Diagnosis not present

## 2015-05-28 DIAGNOSIS — D485 Neoplasm of uncertain behavior of skin: Secondary | ICD-10-CM | POA: Diagnosis not present

## 2015-05-28 DIAGNOSIS — L570 Actinic keratosis: Secondary | ICD-10-CM | POA: Diagnosis not present

## 2015-06-28 ENCOUNTER — Ambulatory Visit (INDEPENDENT_AMBULATORY_CARE_PROVIDER_SITE_OTHER): Payer: Medicare Other | Admitting: *Deleted

## 2015-06-28 DIAGNOSIS — I482 Chronic atrial fibrillation, unspecified: Secondary | ICD-10-CM

## 2015-06-28 DIAGNOSIS — Z7901 Long term (current) use of anticoagulants: Secondary | ICD-10-CM | POA: Diagnosis not present

## 2015-06-28 LAB — POCT INR: INR: 2.3

## 2015-07-29 DIAGNOSIS — R310 Gross hematuria: Secondary | ICD-10-CM | POA: Diagnosis not present

## 2015-07-29 DIAGNOSIS — N3040 Irradiation cystitis without hematuria: Secondary | ICD-10-CM | POA: Diagnosis not present

## 2015-07-29 DIAGNOSIS — C61 Malignant neoplasm of prostate: Secondary | ICD-10-CM | POA: Diagnosis not present

## 2015-07-29 DIAGNOSIS — N201 Calculus of ureter: Secondary | ICD-10-CM | POA: Diagnosis not present

## 2015-07-29 DIAGNOSIS — N200 Calculus of kidney: Secondary | ICD-10-CM | POA: Diagnosis not present

## 2015-07-29 DIAGNOSIS — Z0000 Encounter for general adult medical examination without abnormal findings: Secondary | ICD-10-CM | POA: Diagnosis not present

## 2015-08-05 ENCOUNTER — Telehealth: Payer: Self-pay | Admitting: Interventional Cardiology

## 2015-08-05 DIAGNOSIS — C672 Malignant neoplasm of lateral wall of bladder: Secondary | ICD-10-CM | POA: Diagnosis not present

## 2015-08-05 DIAGNOSIS — R310 Gross hematuria: Secondary | ICD-10-CM | POA: Diagnosis not present

## 2015-08-05 DIAGNOSIS — N3040 Irradiation cystitis without hematuria: Secondary | ICD-10-CM | POA: Diagnosis not present

## 2015-08-05 DIAGNOSIS — Z0000 Encounter for general adult medical examination without abnormal findings: Secondary | ICD-10-CM | POA: Diagnosis not present

## 2015-08-05 DIAGNOSIS — C61 Malignant neoplasm of prostate: Secondary | ICD-10-CM | POA: Diagnosis not present

## 2015-08-05 NOTE — Telephone Encounter (Signed)
Request for surgical clearance:  1. What type of surgery is being performed? Bladder tumor removal   2. When is this surgery scheduled? Pending (but wants him scheduled this week)  3. Are there any medications that need to be held prior to surgery and how long? She did not specify   4. Name of physician performing surgery? Dr. Jeffie Pollock  5. What is your office phone and fax number? [phone]) 6.

## 2015-08-05 NOTE — Telephone Encounter (Signed)
Routed to Dr.Smith to advise 

## 2015-08-06 NOTE — Telephone Encounter (Signed)
Cardiac clearnace placed in MR nurse fax box to be faxed to [facility] attn: Dr.Wrenn.  Update fwd to [facility]

## 2015-08-06 NOTE — Telephone Encounter (Signed)
Cleared for surgery. Will be at increased risk due to age and co-morbidities. Okay to hold coumadin for 5 days prior to procedure. No need for bridging.

## 2015-08-07 ENCOUNTER — Other Ambulatory Visit: Payer: Self-pay | Admitting: Urology

## 2015-08-07 NOTE — H&P (Signed)
Chief Complaint I have blood in my urine.   Active Problems Problems  1. Anticoagulant long-term use (Z79.01) 2. Bladder neck obstruction (N32.0) 3. Gross hematuria (R31.0) 4. Irradiation cystitis (N30.40) 5. Nephrolithiasis (N20.0) 6. Prostate cancer (C61)   XRT 2003  History of Present Illness Vincent Foster is an 82 yo WM with a history of prostate cancer treated with EXRT in 2004.  He has had some recurrent hematuria over the years and had to have a bleeder cauterized by Dr. Rosana Hoes remotely. The last PSA was in 2012 and it was up slightly at 0.63 when he was seen by Dr. Diona Fanti. He began to have gross hematuria yesterday morning. He passed some dried blood but now is having bloody urine with clots. He is on warfarin which he last took last night. He has had no hematuria. He has had no increased frequency but he goes q1hr with his lasix. He has had no flank pain and just had a single stone remotely. He has had no prior UTI's or other GU surgery.   Past Medical History Problems  1. History of Asthma (J45.909) 2. History of Atrial fibrillation (I48.91) 3. History of arthritis (Z87.39) 4. History of esophageal reflux (Z87.19) 5. History of gout (Z87.39) 6. History of hypercholesterolemia (Z86.39) 7. History of hypertension (Z86.79) 8. History of prostate cancer (Z85.46) 9. History of renal calculi (Z87.442) 10. History of sleep apnea (Z86.69)  Surgical History Problems  1. History of Appendectomy 2. History of Cystoscopy With Ureteral Catheterization 3. History of Tonsillectomy  Current Meds 1. Allopurinol 100 MG Oral Tablet;  Therapy: (Recorded:06Feb2017) to Recorded 2. Coumadin SOLR;  Therapy: (Recorded:20Nov2007) to Recorded 3. Furosemide 20 MG Oral Tablet;  Therapy: (Recorded:06Feb2017) to Recorded 4. Metoprolol Tartrate 50 MG Oral Tablet;  Therapy: (Recorded:06Feb2017) to Recorded 5. Nasacort AERS;  Therapy: (Recorded:06Feb2017) to Recorded 6. Potassium Chloride  10 MEQ TBCR;  Therapy: (Recorded:06Feb2017) to Recorded 7. PreserVision/Lutein Oral Capsule;  Therapy: (Recorded:06Feb2017) to Recorded 8. Refresh SOLN;  Therapy: (Recorded:06Feb2017) to Recorded 9. Vitamin D3 CAPS;  Therapy: (Recorded:06Feb2017) to Recorded 10. Warfarin Sodium TABS;   Therapy: (Recorded:06Feb2017) to Recorded  Allergies Medication  1. Td 2. Tetanus Toxoids  Family History Problems  1. Family history of Family Health Status Number Of Children   3 SONS  Social History Problems    Alcohol Use (History)   2 PER DAY   Former smoker [phone])   quit smoking in 1988   Marital History - Currently Married   Occupation:   RETIRED   Three children  Review of Systems Genitourinary, constitutional, skin, eye, otolaryngeal, hematologic/lymphatic, cardiovascular, pulmonary, endocrine, musculoskeletal, gastrointestinal, neurological and psychiatric system(s) were reviewed and pertinent findings if present are noted and are otherwise negative.  Genitourinary: urinary frequency, nocturia and weak urinary stream.    Vitals Vital Signs [Data Includes: Last 1 Day]  Recorded: [id number] 12:42PM  Height: 6 ft  Weight: 237 lb  BMI Calculated: 32.14 BSA Calculated: 2.29 Blood Pressure: 132 / 76 Temperature: 97 F Heart Rate: 66  Physical Exam Constitutional: Well nourished and well developed . No acute distress.  ENT:. The ears and nose are normal in appearance.  Neck: The appearance of the neck is normal and no neck mass is present.  Pulmonary: No respiratory distress and normal respiratory rhythm and effort.  Cardiovascular: Heart rate and rhythm are normal . No peripheral edema.  Abdomen: The abdomen is mildly obese. The abdomen is soft and nontender. No masses are palpated. No CVA tenderness. No hernias are palpable.  No hepatosplenomegaly noted.  Rectal: Rectal exam demonstrates normal sphincter tone, no tenderness and no masses. The prostate is smooth and  flat. The prostate has no nodularity and is not tender. The left seminal vesicle is nonpalpable. The right seminal vesicle is nonpalpable. The perineum is normal on inspection.  Genitourinary: Examination of the penis demonstrates no discharge, no masses, no lesions and a normal meatus. The scrotum is normal in appearance and without lesions. The right epididymis is palpably normal and non-tender. The left epididymis is palpably normal and non-tender. The right testis is non-tender and without masses. The left testis is non-tender and without masses.  Lymphatics: The femoral and inguinal nodes are not enlarged or tender.  Skin: Normal skin turgor, no visible rash and no visible skin lesions.  Neuro/Psych:. Mood and affect are appropriate. Normal sensation of the perineum/perianal region (S3,4,5).    Results/Data Urine [Data Includes: Last 1 Day]   [id number]  COLOR BLOODY   APPEARANCE TURBID   SPECIFIC GRAVITY    pH TNP   GLUCOSE TNP   BILIRUBIN TNP   KETONE TNP   BLOOD TNP   PROTEIN TNP   NITRITE TNP   LEUKOCYTE ESTERASE TNP   SQUAMOUS EPITHELIAL/HPF NONE SEEN HPF  WBC 10-20 WBC/HPF  RBC PACKED RBC/HPF  BACTERIA FEW HPF  CRYSTALS NONE SEEN HPF  CASTS NONE SEEN LPF  Other    Yeast NONE SEEN HPF   Old records or history reviewed: Prior office notes reviewed.  Renal US today shows an 11.10cm right kidney with a 1.83cm area at the upper pole without blood flow. there is a 4.3m possible RLP stone. No hydro is noted. The left kidney is 12.43cm with a possible 4.711mLLP stone but no hydro. He has a possible dromedary hump in the mid pole. The bladder has 7790mvr but no clots or wall abnormalities.  The following clinical lab reports were reviewed:  UA reviewed.    Assessment Assessed  1. Gross hematuria (R31.0) 2. Nephrolithiasis (N20.0) 3. Prostate cancer (C61) 4. Irradiation cystitis (N30.40) 5. Anticoagulant long-term use (Z79.01)  He has gross hematuria but no retention and no  clots are seen in the bladder.  He is on warfarin.   He has a history of radiation cystitis and with some pyuria today he may have a UTI.   He has small non-obstructing renal stones.   Plan  Gross hematuria  1. Start: Nitrofurantoin Monohyd Macro 100 MG Oral Capsule; TAKE 1 CAPSULE TWICE  DAILY 2. Follow-up Week x 1 Office  Follow-up  Status: Hold For - Appointment,Date of Service   Requested for: [id number] BUN & CREATININE; Status:Hold For - Specimen/Data Collection,Appointment;  Requested for:56Feb2017;  4. RENAL U/S COMPLETE; Status:Resulted - Requires Verification;   Done: 0: 61WER15402:05PM Health Maintenance  5. UA With REFLEX; [Do Not Release]; Status:Resulted - Requires Verification;   Done:  :  08QPY1950:03PM Irradiation cystitis  6. Cysto; Status:Hold For - Appointment,Date of Service; Requested for:56Feb2017;  Prostate cancer  7. PSA; Status:Hold For - Specimen/Data Collection,Appointment; Requested  [phone]  8. Stop: Coumadin SOLR  urine culture.  PSA and BUN/Cr.  I will start him on macrobid pending the culture and will have him hold the warfarin until the hematuria clears.  F/U in 1 week for cystoscopy.        URINE CULTURE; Status:In Progress - Specimen/Data Collected;  Done: [id number]:[facility]; Due:08Feb2017; Marked Important; Last Updated By:LZ:JQBHALarSantiago Glad/11/2015 1:12:06 PM;Ordered; Today;   ForPFX:TKWIOmaturia; Ordered  PZ:WCHEN, Jenny Reichmann;   Discussion/Summary CC: Dr. Lavone Orn and Dr. Daneen Schick.     Active Problems Problems  1. Anticoagulant long-term use (Z79.01) 2. Bladder neck obstruction (N32.0) 3. Gross hematuria (R31.0) 4. Irradiation cystitis (N30.40) 5. Malignant neoplasm of lateral wall of bladder (C67.2) 6. Nephrolithiasis (N20.0) 7. Prostate cancer (C61)   XRT 2003  History of Present Illness Vincent Foster returns today in f/u for cystoscopy to complete his hematuria w/u. His culture grew Mx  species. He is off of the warfarin but continues to have some initial clot passage. His PSA was up to 2.04 from 0.63 in 2012.     ROS: -fever or abdominal pain.   Results/Data Urine [Data Includes: Last 1 Day]   [id number]  COLOR RED   APPEARANCE CLOUDY   SPECIFIC GRAVITY 1.010   pH 5.0   GLUCOSE NEGATIVE   BILIRUBIN NEGATIVE   KETONE NEGATIVE   BLOOD 3+   PROTEIN 1+   NITRITE NEGATIVE   LEUKOCYTE ESTERASE NEGATIVE   SQUAMOUS EPITHELIAL/HPF NONE SEEN HPF  WBC 0-5 WBC/HPF  RBC 40-60 RBC/HPF  BACTERIA NONE SEEN HPF  CRYSTALS NONE SEEN HPF  CASTS NONE SEEN LPF  Yeast NONE SEEN HPF  Selected Results  PSA [id number] 02:41PM Vincent Foster  SPECIMEN TYPE: BLOOD   Test Name Result Flag Reference  PSA 2.04 ng/mL  <=4.00  TEST METHODOLOGY: ECLIA PSA (ELECTROCHEMILUMINESCENCE IMMUNOASSAY)   BUN & CREATININE [id number] 02:32PM Vincent Foster  SPECIMEN TYPE: BLOOD   Test Name Result Flag Reference  CREATININE 1.37 mg/dL  0.50-1.50  BUN 33 mg/dL H 7-25  Est GFR, African American 52 mL/min L >=60  Est GFR, NonAfrican American 45 mL/min L >=60  THE ESTIMATED GFR IS A CALCULATION VALID FOR ADULTS (>=9 YEARS OLD) THAT USES THE CKD-EPI ALGORITHM TO ADJUST FOR AGE AND SEX. IT IS   NOT TO BE USED FOR CHILDREN, PREGNANT WOMEN, HOSPITALIZED PATIENTS,    PATIENTS ON DIALYSIS, OR WITH RAPIDLY CHANGING KIDNEY FUNCTION. ACCORDING TO THE NKDEP, EGFR >89 IS NORMAL, 60-89 SHOWS MILD IMPAIRMENT, 30-59 SHOWS MODERATE IMPAIRMENT, 15-29 SHOWS SEVERE IMPAIRMENT AND <15 IS ESRD.   URINE CULTURE [id number] 01:11PM Vincent Foster  SOURCE : CLEAN CATCH SPECIMEN TYPE: CLEAN CATCH   Test Name Result Flag Reference  CULTURE, URINE Culture, Urine    ===== COLONY COUNT: =====  15,000 COLONIES/ML   FINAL REPORT: MULTIPLE BACTERIAL MORPHOTYPES PRESENT, NONE  PREDOMINANT. SUGGEST APPROPRIATE RECOLLECTION IF   CLINICALLY INDICATED.   Procedure  Procedure: Cystoscopy   Indication: Hematuria.  Informed  Consent: Risks, benefits, and potential adverse events were discussed and informed consent was obtained from the patient.  Prep: The patient was prepped with betadine.  Antibiotic prophylaxis: Ciprofloxacin.  Procedure Note:  Urethral meatus:. No abnormalities.  Anterior urethra: No abnormalities.  Prostatic urethra: No abnormalities . Estimated length was 3 cm. There was visual obstruction of the prostatic urethra. The lateral prostatic lobes were enlarged. There is blanching and some neovascularity but no active bleeding.  Bladder: Visulization was obscured due to blood. The ureteral orifices were in the normal anatomic position bilaterally and had clear efflux of urine. The mucosa was smooth without abnormalities. Examination of the bladder demonstrated a small amount of fresh clot within the bladder and moderate trabeculation. A solitary tumor was visualized in the bladder. A papillary tumor was seen in the bladder measuring approximately 2x3 cm in size. This tumor was located on the left side of the bladder. The patient tolerated the procedure well.  Complications: None.    Assessment Assessed  1. Malignant neoplasm of lateral wall of bladder (C67.2) 2. Gross hematuria (R31.0) 3. Prostate cancer [facility])  He has a 2 x 3cm tumor on the left lateral bladder wall and some radiation changes in the prostate.  His PSA is up to 2.04 which is consistent with recurrent prostate cancer but I don't think he needs treatment for that at this time since his PSA doubling time is about 4 years.   Plan Health Maintenance  1. UA With REFLEX; [Do Not Release]; Status:Resulted - Requires Verification;   Done:  [id number] 03:10PM Malignant neoplasm of lateral wall of bladder  2. Follow-up Schedule Surgery Office  Follow-up  Status: Hold For - Appointment   Requested for: [phone]  I am going to get him set up for cystoscopy with bilateral RTG's, TURBT and possible MMC instillation. I reviewed the risks of  bleeding, infection, bladder or ureteral injury, need for secondary procedures, chemical cystitis, thrombotic events and anesthetic complications.   He will need a repeat PSA in 6 months.   Discussion/Summary CC: Dr. Lavone Orn and Dr. Daneen Schick.

## 2015-08-08 ENCOUNTER — Encounter (HOSPITAL_COMMUNITY): Payer: Self-pay | Admitting: *Deleted

## 2015-08-08 ENCOUNTER — Ambulatory Visit (HOSPITAL_COMMUNITY): Payer: Medicare Other | Admitting: Anesthesiology

## 2015-08-08 ENCOUNTER — Encounter (HOSPITAL_COMMUNITY)
Admission: RE | Disposition: A | Discharge status: Home / Self Care | Payer: Self-pay | Source: Ambulatory Visit | Attending: Urology

## 2015-08-08 ENCOUNTER — Ambulatory Visit (HOSPITAL_COMMUNITY)
Admission: RE | Admit: 2015-08-08 | Discharge: 2015-08-08 | Disposition: A | Discharge status: Home / Self Care | Payer: Medicare Other | Source: Ambulatory Visit | Attending: Urology | Admitting: Urology

## 2015-08-08 DIAGNOSIS — N3041 Irradiation cystitis with hematuria: Secondary | ICD-10-CM | POA: Diagnosis not present

## 2015-08-08 DIAGNOSIS — C672 Malignant neoplasm of lateral wall of bladder: Secondary | ICD-10-CM | POA: Diagnosis not present

## 2015-08-08 DIAGNOSIS — I4891 Unspecified atrial fibrillation: Secondary | ICD-10-CM | POA: Diagnosis not present

## 2015-08-08 DIAGNOSIS — Y842 Radiological procedure and radiotherapy as the cause of abnormal reaction of the patient, or of later complication, without mention of misadventure at the time of the procedure: Secondary | ICD-10-CM | POA: Diagnosis not present

## 2015-08-08 DIAGNOSIS — N320 Bladder-neck obstruction: Secondary | ICD-10-CM | POA: Diagnosis not present

## 2015-08-08 DIAGNOSIS — M1990 Unspecified osteoarthritis, unspecified site: Secondary | ICD-10-CM | POA: Insufficient documentation

## 2015-08-08 DIAGNOSIS — Z7901 Long term (current) use of anticoagulants: Secondary | ICD-10-CM | POA: Diagnosis not present

## 2015-08-08 DIAGNOSIS — G4730 Sleep apnea, unspecified: Secondary | ICD-10-CM | POA: Diagnosis not present

## 2015-08-08 DIAGNOSIS — E7800 Pure hypercholesterolemia, unspecified: Secondary | ICD-10-CM | POA: Diagnosis not present

## 2015-08-08 DIAGNOSIS — R310 Gross hematuria: Secondary | ICD-10-CM | POA: Diagnosis not present

## 2015-08-08 DIAGNOSIS — I10 Essential (primary) hypertension: Secondary | ICD-10-CM | POA: Diagnosis not present

## 2015-08-08 DIAGNOSIS — I129 Hypertensive chronic kidney disease with stage 1 through stage 4 chronic kidney disease, or unspecified chronic kidney disease: Secondary | ICD-10-CM | POA: Diagnosis not present

## 2015-08-08 DIAGNOSIS — Z87442 Personal history of urinary calculi: Secondary | ICD-10-CM | POA: Insufficient documentation

## 2015-08-08 DIAGNOSIS — Z8546 Personal history of malignant neoplasm of prostate: Secondary | ICD-10-CM | POA: Diagnosis not present

## 2015-08-08 DIAGNOSIS — M109 Gout, unspecified: Secondary | ICD-10-CM | POA: Insufficient documentation

## 2015-08-08 DIAGNOSIS — Z923 Personal history of irradiation: Secondary | ICD-10-CM | POA: Diagnosis not present

## 2015-08-08 DIAGNOSIS — J449 Chronic obstructive pulmonary disease, unspecified: Secondary | ICD-10-CM | POA: Insufficient documentation

## 2015-08-08 DIAGNOSIS — Z79899 Other long term (current) drug therapy: Secondary | ICD-10-CM | POA: Diagnosis not present

## 2015-08-08 DIAGNOSIS — K219 Gastro-esophageal reflux disease without esophagitis: Secondary | ICD-10-CM | POA: Insufficient documentation

## 2015-08-08 DIAGNOSIS — Z87891 Personal history of nicotine dependence: Secondary | ICD-10-CM | POA: Diagnosis not present

## 2015-08-08 DIAGNOSIS — N3289 Other specified disorders of bladder: Secondary | ICD-10-CM | POA: Diagnosis not present

## 2015-08-08 DIAGNOSIS — N189 Chronic kidney disease, unspecified: Secondary | ICD-10-CM | POA: Diagnosis not present

## 2015-08-08 HISTORY — PX: TRANSURETHRAL RESECTION OF BLADDER TUMOR: SHX2575

## 2015-08-08 HISTORY — PX: CYSTOSCOPY/RETROGRADE/URETEROSCOPY: SHX5316

## 2015-08-08 LAB — CBC
HEMATOCRIT: 35.4 % — AB (ref 39.0–52.0)
Hemoglobin: 12.3 g/dL — ABNORMAL LOW (ref 13.0–17.0)
MCH: 33 pg (ref 26.0–34.0)
MCHC: 34.7 g/dL (ref 30.0–36.0)
MCV: 94.9 fL (ref 78.0–100.0)
Platelets: 180 10*3/uL (ref 150–400)
RBC: 3.73 MIL/uL — ABNORMAL LOW (ref 4.22–5.81)
RDW: 14.8 % (ref 11.5–15.5)
WBC: 5.6 10*3/uL (ref 4.0–10.5)

## 2015-08-08 LAB — BASIC METABOLIC PANEL
Anion gap: 8 (ref 5–15)
BUN: 24 mg/dL — AB (ref 6–20)
CHLORIDE: 96 mmol/L — AB (ref 101–111)
CO2: 23 mmol/L (ref 22–32)
Calcium: 9.2 mg/dL (ref 8.9–10.3)
Creatinine, Ser: 1.31 mg/dL — ABNORMAL HIGH (ref 0.61–1.24)
GFR calc Af Amer: 54 mL/min — ABNORMAL LOW (ref >60)
GFR calc non Af Amer: 47 mL/min — ABNORMAL LOW (ref >60)
GLUCOSE: 103 mg/dL — AB (ref 65–99)
POTASSIUM: 4.4 mmol/L (ref 3.5–5.1)
Sodium: 127 mmol/L — ABNORMAL LOW (ref 135–145)

## 2015-08-08 LAB — PROTIME-INR
INR: 1.09 (ref 0.00–1.49)
Prothrombin Time: 14.3 seconds (ref 11.6–15.2)

## 2015-08-08 SURGERY — CYSTOSCOPY/RETROGRADE/URETEROSCOPY
Anesthesia: General | Site: Bladder

## 2015-08-08 MED ORDER — CEFAZOLIN SODIUM-DEXTROSE 2-3 GM-% IV SOLR
2.0000 g | INTRAVENOUS | Status: AC
  Administered 2015-08-08: 2 g via INTRAVENOUS

## 2015-08-08 MED ORDER — DEXAMETHASONE SODIUM PHOSPHATE 10 MG/ML IJ SOLN
INTRAMUSCULAR | Status: DC | PRN
  Administered 2015-08-08: 10 mg via INTRAVENOUS

## 2015-08-08 MED ORDER — 0.9 % SODIUM CHLORIDE (POUR BTL) OPTIME
TOPICAL | Status: DC | PRN
  Administered 2015-08-08: 1000 mL

## 2015-08-08 MED ORDER — LIDOCAINE HCL (CARDIAC) 20 MG/ML IV SOLN
INTRAVENOUS | Status: AC
  Filled 2015-08-08: qty 5

## 2015-08-08 MED ORDER — FENTANYL CITRATE (PF) 250 MCG/5ML IJ SOLN
INTRAMUSCULAR | Status: AC
  Filled 2015-08-08: qty 5

## 2015-08-08 MED ORDER — OXYCODONE HCL 5 MG/5ML PO SOLN
5.0000 mg | Freq: Once | ORAL | Status: DC | PRN
  Filled 2015-08-08: qty 5

## 2015-08-08 MED ORDER — PHENYLEPHRINE HCL 10 MG/ML IJ SOLN
INTRAMUSCULAR | Status: DC | PRN
  Administered 2015-08-08 (×4): 80 ug via INTRAVENOUS

## 2015-08-08 MED ORDER — ROCURONIUM BROMIDE 100 MG/10ML IV SOLN
INTRAVENOUS | Status: DC | PRN
  Administered 2015-08-08: 30 mg via INTRAVENOUS

## 2015-08-08 MED ORDER — SUGAMMADEX SODIUM 200 MG/2ML IV SOLN
INTRAVENOUS | Status: AC
  Filled 2015-08-08: qty 2

## 2015-08-08 MED ORDER — METOPROLOL TARTRATE 50 MG PO TABS
50.0000 mg | ORAL_TABLET | Freq: Once | ORAL | Status: AC
  Administered 2015-08-08: 50 mg via ORAL
  Filled 2015-08-08: qty 1

## 2015-08-08 MED ORDER — PROPOFOL 10 MG/ML IV BOLUS
INTRAVENOUS | Status: DC | PRN
  Administered 2015-08-08: 120 mg via INTRAVENOUS

## 2015-08-08 MED ORDER — FENTANYL CITRATE (PF) 100 MCG/2ML IJ SOLN
INTRAMUSCULAR | Status: DC | PRN
  Administered 2015-08-08 (×3): 50 ug via INTRAVENOUS

## 2015-08-08 MED ORDER — MITOMYCIN CHEMO FOR BLADDER INSTILLATION 40 MG
40.0000 mg | Freq: Once | INTRAVENOUS | Status: DC
Start: 2015-08-08 — End: 2015-08-08

## 2015-08-08 MED ORDER — LIDOCAINE HCL 2 % EX GEL
CUTANEOUS | Status: AC
  Filled 2015-08-08: qty 5

## 2015-08-08 MED ORDER — PHENYLEPHRINE 40 MCG/ML (10ML) SYRINGE FOR IV PUSH (FOR BLOOD PRESSURE SUPPORT)
PREFILLED_SYRINGE | INTRAVENOUS | Status: AC
  Filled 2015-08-08: qty 10

## 2015-08-08 MED ORDER — DEXAMETHASONE SODIUM PHOSPHATE 10 MG/ML IJ SOLN
INTRAMUSCULAR | Status: AC
  Filled 2015-08-08: qty 1

## 2015-08-08 MED ORDER — SODIUM CHLORIDE 0.9 % IR SOLN
Status: DC | PRN
  Administered 2015-08-08: 6000 mL

## 2015-08-08 MED ORDER — ONDANSETRON HCL 4 MG/2ML IJ SOLN
INTRAMUSCULAR | Status: AC
  Filled 2015-08-08: qty 2

## 2015-08-08 MED ORDER — TRAMADOL-ACETAMINOPHEN 37.5-325 MG PO TABS: 1.0000 | ORAL_TABLET | Freq: Four times a day (QID) | ORAL | Status: DC | PRN

## 2015-08-08 MED ORDER — MITOMYCIN CHEMO FOR BLADDER INSTILLATION 40 MG
40.0000 mg | Freq: Once | INTRAVENOUS | Status: AC
  Administered 2015-08-08: 40 mg via INTRAVESICAL
  Filled 2015-08-08: qty 40

## 2015-08-08 MED ORDER — ONDANSETRON HCL 4 MG/2ML IJ SOLN: 4.0000 mg | Freq: Four times a day (QID) | INTRAMUSCULAR | Status: DC | PRN

## 2015-08-08 MED ORDER — FENTANYL CITRATE (PF) 100 MCG/2ML IJ SOLN
25.0000 ug | INTRAMUSCULAR | Status: DC | PRN
  Administered 2015-08-08 (×2): 25 ug via INTRAVENOUS
  Administered 2015-08-08: 50 ug via INTRAVENOUS

## 2015-08-08 MED ORDER — SUGAMMADEX SODIUM 200 MG/2ML IV SOLN
INTRAVENOUS | Status: DC | PRN
  Administered 2015-08-08: 200 mg via INTRAVENOUS

## 2015-08-08 MED ORDER — FENTANYL CITRATE (PF) 100 MCG/2ML IJ SOLN
INTRAMUSCULAR | Status: AC
  Filled 2015-08-08: qty 2

## 2015-08-08 MED ORDER — OXYCODONE HCL 5 MG PO TABS: 5.0000 mg | ORAL_TABLET | Freq: Once | ORAL | Status: DC | PRN

## 2015-08-08 MED ORDER — CEFAZOLIN SODIUM-DEXTROSE 2-3 GM-% IV SOLR
INTRAVENOUS | Status: AC
  Filled 2015-08-08: qty 50

## 2015-08-08 MED ORDER — ONDANSETRON HCL 4 MG/2ML IJ SOLN
INTRAMUSCULAR | Status: DC | PRN
  Administered 2015-08-08: 4 mg via INTRAVENOUS

## 2015-08-08 MED ORDER — LIDOCAINE HCL (CARDIAC) 20 MG/ML IV SOLN
INTRAVENOUS | Status: DC | PRN
  Administered 2015-08-08: 100 mg via INTRAVENOUS

## 2015-08-08 MED ORDER — SUCCINYLCHOLINE CHLORIDE 20 MG/ML IJ SOLN
INTRAMUSCULAR | Status: DC | PRN
  Administered 2015-08-08: 100 mg via INTRAVENOUS

## 2015-08-08 MED ORDER — LACTATED RINGERS IV SOLN
INTRAVENOUS | Status: DC
  Administered 2015-08-08: 1000 mL via INTRAVENOUS
  Administered 2015-08-08: 17:00:00 via INTRAVENOUS

## 2015-08-08 MED ORDER — PROPOFOL 10 MG/ML IV BOLUS
INTRAVENOUS | Status: AC
  Filled 2015-08-08: qty 20

## 2015-08-08 MED ORDER — IOHEXOL 300 MG/ML  SOLN
INTRAMUSCULAR | Status: DC | PRN
  Administered 2015-08-08: 10 mL via URETHRAL

## 2015-08-08 SURGICAL SUPPLY — 31 items
BAG URINE DRAINAGE (UROLOGICAL SUPPLIES) ×2 IMPLANT
BAG URO CATCHER STRL LF (MISCELLANEOUS) ×3 IMPLANT
BASKET LASER NITINOL 1.9FR (BASKET) ×1 IMPLANT
BASKET ZERO TIP NITINOL 2.4FR (BASKET) IMPLANT
BSKT STON RTRVL 120 1.9FR (BASKET)
BSKT STON RTRVL ZERO TP 2.4FR (BASKET)
CATH FOLEY 3WAY 30CC 22FR (CATHETERS) IMPLANT
CATH URET 5FR 28IN OPEN ENDED (CATHETERS) ×2 IMPLANT
GLOVE BIOGEL PI IND STRL 6.5 (GLOVE) IMPLANT
GLOVE BIOGEL PI IND STRL 7.5 (GLOVE) IMPLANT
GLOVE BIOGEL PI INDICATOR 6.5 (GLOVE) ×2
GLOVE BIOGEL PI INDICATOR 7.5 (GLOVE) ×2
GLOVE SURG SS PI 7.5 STRL IVOR (GLOVE) ×2 IMPLANT
GLOVE SURG SS PI 8.0 STRL IVOR (GLOVE) ×2 IMPLANT
GOWN STRL REIN 2XL XLG LVL4 (GOWN DISPOSABLE) ×2 IMPLANT
GOWN STRL REUS W/TWL XL LVL3 (GOWN DISPOSABLE) ×5 IMPLANT
GUIDEWIRE ANG ZIPWIRE 038X150 (WIRE) IMPLANT
GUIDEWIRE STR DUAL SENSOR (WIRE) ×2 IMPLANT
HOLDER FOLEY CATH W/STRAP (MISCELLANEOUS) ×2 IMPLANT
IV NS IRRIG 3000ML ARTHROMATIC (IV SOLUTION) ×4 IMPLANT
KIT ASPIRATION TUBING (SET/KITS/TRAYS/PACK) ×1 IMPLANT
LOOP CUT BIPOLAR 24F LRG (ELECTROSURGICAL) ×2 IMPLANT
MANIFOLD NEPTUNE II (INSTRUMENTS) ×3 IMPLANT
NS IRRIG 1000ML POUR BTL (IV SOLUTION) ×2 IMPLANT
PACK CYSTO (CUSTOM PROCEDURE TRAY) ×3 IMPLANT
SUT ETHILON 3 0 PS 1 (SUTURE) IMPLANT
SYR 30ML LL (SYRINGE) ×2 IMPLANT
SYRINGE IRR TOOMEY STRL 70CC (SYRINGE) ×2 IMPLANT
TUBING CONNECTING 10 (TUBING) ×2 IMPLANT
TUBING CONNECTING 10' (TUBING) ×1
WIRE COONS/BENSON .038X145CM (WIRE) ×1 IMPLANT

## 2015-08-08 NOTE — Op Note (Signed)
NAME:  LAURIANO, CZAPLA             ACCOUNT NO.:  [id number]  MEDICAL RECORD NO.:  [id number]  LOCATION:  [facility]                         FACILITY:  [facility]  PHYSICIAN:  Marshall Cork. Jeffie Pollock, M.D.    DATE OF BIRTH:  Jun 29, 1923  DATE OF PROCEDURE:  08/08/2015 DATE OF DISCHARGE:  08/08/2015                              OPERATIVE REPORT   PROCEDURES: 1. Cystoscopy with bilateral retrograde pyelograms and interpretation. 2. Transurethral resection of 3 x 4-cm bladder tumor for instillation     of mitomycin-C (in PACU).  PREOPERATIVE DIAGNOSIS:  Left lateral wall bladder tumor.  POSTOPERATIVE DIAGNOSIS:  Left lateral wall bladder tumor.  SURGEON:  Marshall Cork. Jeffie Pollock, M.D.  ANESTHESIA:  General.  SPECIMEN:  Bladder tumor chips.  DRAINS:  A 20-French Foley catheter.  BLOOD LOSS:  Minimal.  COMPLICATIONS:  None.  INDICATIONS:  Mr. Dollarhide is an 82 year old white male with prior history of prostate cancer treatment, who was seen recently in followup and he was found to have hematuria.  Renal ultrasound was unremarkable. Cystoscopy, however, revealed a moderate bladder tumor on the left lateral wall.  It was felt that resection and instillation of mitomycin- C was indicated since he has only had ultrasound and bilateral retrograde pyelography.  FINDINGS OF PROCEDURE:  He was given Ancef.  He was taken to the operating room where general anesthetic was induced.  He was placed in lithotomy position.  His perineum and genitalia were prepped with Betadine solution.  He was draped in usual sterile fashion.  He was fitted with PAS hose.  Cystoscopy was performed after urethral calibration to 32-French with Leander Rams sounds because of meatal narrowing.  The cystoscope used was a 23-French with 30-degree lens.  Examination revealed an otherwise normal urethra.  The external sphincter was intact.  The prostatic urethra was approximately 3 cm in length with high bladder neck, but no  significant obstruction.  There was blanching of the mucosa consistent with prior radiation.  Inspection of bladder revealed moderate trabeculation with some small diverticula at the base.  There were telangiectasias of the mucosa at the base consistent with prior radiation therapy.  Ureteral orifices were unremarkable.  On the left lateral wall, anteriorly, there was a 3 x 4-cm papillary tumor with some active bleeding.  No other mucosal lesions were identified.  After thorough inspection, bilateral retrograde pyelography was performed using a 5-French open-ended catheter and Omnipaque.  Right retrograde pyelogram revealed a normal ureter and intrarenal collecting system.  Left retrograde pyelogram revealed a normal ureter and intrarenal collecting system.  After completion of retrograde pyelogram, the cystoscope was removed and was replaced with a 28-French continuous flow resectoscope sheath, which was placed via visual obturator.  This was fitted with an Beatrix Fetters handle with the bipolar loop and a 30- degree lens saline was used as an irrigant.  The patient had been given a paralytic with his anesthetic to ensure a low risk of obturator reflex.  He then underwent resection of the tumor down to muscular fibers.  Once the tumor had been resected, the surrounding mucosa was fulgurated along with the bed of the tumor to ensure destruction of the lesion.  It appeared that this was  a superficial tumor.  Once complete resection had been performed, the bladder was evacuated free of chips.  Reinspection revealed no active bleeding.  The scope was removed and a 20-French Foley catheter was inserted.  The balloon was filled with 10 mL and irrigated with clear return.  The catheter was placed to the straight drainage.  The patient's anesthetic was reversed. He was taken down from lithotomy position.  His anesthetic was reversed and he was moved to the recovery room in stable  condition.  In the recovery room, he was instilled with 40 mg of mitomycin-C and 20 mL of diluent, this was left indwelling for 1 hour, and the bladder was then drained.  He was discharged home with Foley, which he will remove at home in the morning.     Marshall Cork. Jeffie Pollock, M.D.     JJW/MEDQ  D:  08/08/2015  T:  08/08/2015  Job:  [id number]

## 2015-08-08 NOTE — Anesthesia Procedure Notes (Signed)
Procedure Name: Intubation Date/Time: 08/08/2015 3:53 PM Performed by: Montel Clock Pre-anesthesia Checklist: Patient identified, Emergency Drugs available, Suction available, Patient being monitored and Timeout performed Patient Re-evaluated:Patient Re-evaluated prior to inductionOxygen Delivery Method: Circle system utilized Preoxygenation: Pre-oxygenation with 100% oxygen Intubation Type: IV induction Ventilation: Mask ventilation without difficulty and Oral airway inserted - appropriate to patient size Laryngoscope Size: Mac and 3 Grade View: Grade I Tube type: Oral Tube size: 7.5 mm Number of attempts: 1 Airway Equipment and Method: Stylet Placement Confirmation: ETT inserted through vocal cords under direct vision,  positive ETCO2 and breath sounds checked- equal and bilateral Secured at: 23 cm Tube secured with: Tape Dental Injury: Teeth and Oropharynx as per pre-operative assessment  Comments: Grade 1 view with downward laryngeal pressure.

## 2015-08-08 NOTE — Anesthesia Postprocedure Evaluation (Signed)
Anesthesia Post Note  Patient: Vincent Foster  Procedure(s) Performed: Procedure(s) (LRB): CYSTOSCOPY WITH BILATERAL  RETROGRADE  PYLEOGRAM (N/A) TRANSURETHRAL RESECTION OF BLADDER TUMOR (TURBT)  (N/A)  Patient location during evaluation: PACU Anesthesia Type: General Level of consciousness: awake and alert and patient cooperative Pain management: pain level controlled Vital Signs Assessment: post-procedure vital signs reviewed and stable Respiratory status: spontaneous breathing and respiratory function stable Cardiovascular status: stable Anesthetic complications: no    Last Vitals:  Filed Vitals:   08/08/15 1715 08/08/15 1730  BP: 158/91 151/82  Pulse: 71 70  Temp:    Resp: 14 14    Last Pain:  Filed Vitals:   08/08/15 1734  PainSc: [place]

## 2015-08-08 NOTE — Transfer of Care (Signed)
Immediate Anesthesia Transfer of Care Note  Patient: Vincent Foster  Procedure(s) Performed: Procedure(s): CYSTOSCOPY WITH BILATERAL  RETROGRADE  PYLEOGRAM (N/A) TRANSURETHRAL RESECTION OF BLADDER TUMOR (TURBT)  (N/A)  Patient Location: PACU  Anesthesia Type:General  Level of Consciousness:  sedated, patient cooperative and responds to stimulation  Airway & Oxygen Therapy:Patient Spontanous Breathing and Patient connected to face mask oxgen  Post-op Assessment:  Report given to PACU RN and Post -op Vital signs reviewed and stable  Post vital signs:  Reviewed and stable  Last Vitals:  Filed Vitals:   08/08/15 1339  BP: 148/74  Pulse: 78  Temp: 36.4 C  Resp: 20    Complications: No apparent anesthesia complications

## 2015-08-08 NOTE — Discharge Instructions (Addendum)
CYSTOSCOPY HOME CARE INSTRUCTIONS  Activity: Rest for the remainder of the day.  Do not drive or operate equipment today.  You may resume normal activities in one to two days as instructed by your physician.   Meals: Drink plenty of liquids and eat light foods such as gelatin or soup this evening.  You may return to a normal meal plan tomorrow.  Return to Work: You may return to work in one to two days or as instructed by your physician.  Special Instructions / Symptoms: Call your physician if any of these symptoms occur:   -persistent or heavy bleeding  -bleeding which continues after first few urination  -large blood clots that are difficult to pass  -urine stream diminishes or stops completely  -fever equal to or higher than 101 degrees Farenheit.  -cloudy urine with a strong, foul odor  -severe pain  Females should always wipe from front to back after elimination.  You may feel some burning pain when you urinate.  This should disappear with time.  Applying moist heat to the lower abdomen or a hot tub bath may help relieve the pain. \  You may remove your catheter in the morning and if you don't feel comfortable doing that you can come to the office.   Patient Signature:  ________________________________________________________  Nurse's Signature:  ________________________________________________________    General Anesthesia, Adult, Care After Refer to this sheet in the next few weeks. These instructions provide you with information on caring for yourself after your procedure. Your health care provider may also give you more specific instructions. Your treatment has been planned according to current medical practices, but problems sometimes occur. Call your health care provider if you have any problems or questions after your procedure. WHAT TO EXPECT AFTER THE PROCEDURE After the procedure, it is typical to experience:  Sleepiness.  Nausea and vomiting. HOME CARE  INSTRUCTIONS  For the first 24 hours after general anesthesia:  Have a responsible person with you.  Do not drive a car. If you are alone, do not take public transportation.  Do not drink alcohol.  Do not take medicine that has not been prescribed by your health care provider.  Do not sign important papers or make important decisions.  You may resume a normal diet and activities as directed by your health care provider.  Change bandages (dressings) as directed.  If you have questions or problems that seem related to general anesthesia, call the hospital and ask for the anesthetist or anesthesiologist on call. SEEK MEDICAL CARE IF:  You have nausea and vomiting that continue the day after anesthesia.  You develop a rash. SEEK IMMEDIATE MEDICAL CARE IF:   You have difficulty breathing.  You have chest pain.  You have any allergic problems.   This information is not intended to replace advice given to you by your health care provider. Make sure you discuss any questions you have with your health care provider.   Document Released: 09/14/2000 Document Revised: 06/29/2014 Document Reviewed: 10/07/2011 Elsevier Interactive Patient Education [employer].

## 2015-08-08 NOTE — Brief Op Note (Signed)
08/08/2015  4:27 PM  PATIENT:  Vincent Foster  82 y.o. male  PRE-OPERATIVE DIAGNOSIS:  BLADDER TUMOR  POST-OPERATIVE DIAGNOSIS:  3x4cm Left lateral wall bladder tumor. PROCEDURE:  Procedure(s): CYSTOSCOPY WITH RETROGRADE  (N/A) TRANSURETHRAL RESECTION OF BLADDER TUMOR 2-5cm  (TURBT)  INSTILLATION OF MITOMYCIN C (N/A)  SURGEON:  Surgeon(s) and Role:    * Irine Seal, MD - Primary  PHYSICIAN ASSISTANT:   ASSISTANTS: none   ANESTHESIA:   general  EBL:  Total I/O In: 800 [I.V.:800] Out: -   BLOOD ADMINISTERED:none  DRAINS: Urinary Catheter (Foley)   LOCAL MEDICATIONS USED:  NONE  SPECIMEN:  Source of Specimen:  left lateral wall bladder tumor.  DISPOSITION OF SPECIMEN:  PATHOLOGY  COUNTS:  YES  TOURNIQUET:  * No tourniquets in log *  DICTATION: .Other Dictation: Dictation Number [id number]  PLAN OF CARE: Discharge to home after PACU  PATIENT DISPOSITION:  PACU - hemodynamically stable.   Delay start of Pharmacological VTE agent (>24hrs) due to surgical blood loss or risk of bleeding: yes

## 2015-08-08 NOTE — Anesthesia Preprocedure Evaluation (Signed)
Anesthesia Evaluation  Patient identified by MRN, date of birth, ID band Patient awake    Reviewed: Allergy & Precautions, NPO status , Patient's Chart, lab work & pertinent test results  Airway Mallampati: II   Neck ROM: full    Dental   Pulmonary COPD, former smoker,    breath sounds clear to auscultation       Cardiovascular hypertension, + dysrhythmias Atrial Fibrillation  Rhythm:regular Rate:Normal     Neuro/Psych    GI/Hepatic   Endo/Other  obese  Renal/GU Renal InsufficiencyRenal disease     Musculoskeletal   Abdominal   Peds  Hematology   Anesthesia Other Findings   Reproductive/Obstetrics                             Anesthesia Physical Anesthesia Plan  ASA: III  Anesthesia Plan: General   Post-op Pain Management:    Induction: Intravenous  Airway Management Planned: LMA  Additional Equipment:   Intra-op Plan:   Post-operative Plan:   Informed Consent: I have reviewed the patients History and Physical, chart, labs and discussed the procedure including the risks, benefits and alternatives for the proposed anesthesia with the patient or authorized representative who has indicated his/her understanding and acceptance.     Plan Discussed with: CRNA, Anesthesiologist and Surgeon  Anesthesia Plan Comments:         Anesthesia Quick Evaluation

## 2015-08-08 NOTE — Interval H&P Note (Signed)
History and Physical Interval Note:  08/08/2015 3:34 PM  Vincent Foster  has presented today for surgery, with the diagnosis of BLADDER TUMOR  The various methods of treatment have been discussed with the patient and family. After consideration of risks, benefits and other options for treatment, the patient has consented to  Procedure(s): CYSTOSCOPY WITH RETROGRADE  (N/A) TRANSURETHRAL RESECTION OF BLADDER TUMOR (TURBT) POSSIBLE INSTILLATION OF MITOMYCIN C (N/A) as a surgical intervention .  The patient's history has been reviewed, patient examined, no change in status, stable for surgery.  I have reviewed the patient's chart and labs.  Questions were answered to the patient's satisfaction.     [name] J

## 2015-08-09 ENCOUNTER — Encounter (HOSPITAL_COMMUNITY): Payer: Self-pay | Admitting: Urology

## 2015-08-21 DIAGNOSIS — C61 Malignant neoplasm of prostate: Secondary | ICD-10-CM | POA: Diagnosis not present

## 2015-08-21 DIAGNOSIS — Z0000 Encounter for general adult medical examination without abnormal findings: Secondary | ICD-10-CM | POA: Diagnosis not present

## 2015-08-21 DIAGNOSIS — C672 Malignant neoplasm of lateral wall of bladder: Secondary | ICD-10-CM | POA: Diagnosis not present

## 2015-08-21 DIAGNOSIS — R310 Gross hematuria: Secondary | ICD-10-CM | POA: Diagnosis not present

## 2015-08-23 ENCOUNTER — Ambulatory Visit (INDEPENDENT_AMBULATORY_CARE_PROVIDER_SITE_OTHER): Payer: Medicare Other | Admitting: *Deleted

## 2015-08-23 DIAGNOSIS — Z7901 Long term (current) use of anticoagulants: Secondary | ICD-10-CM | POA: Diagnosis not present

## 2015-08-23 DIAGNOSIS — I482 Chronic atrial fibrillation, unspecified: Secondary | ICD-10-CM

## 2015-08-23 LAB — POCT INR: INR: 1.3

## 2015-08-28 DIAGNOSIS — C44311 Basal cell carcinoma of skin of nose: Secondary | ICD-10-CM | POA: Diagnosis not present

## 2015-08-28 DIAGNOSIS — D0439 Carcinoma in situ of skin of other parts of face: Secondary | ICD-10-CM | POA: Diagnosis not present

## 2015-08-28 DIAGNOSIS — L820 Inflamed seborrheic keratosis: Secondary | ICD-10-CM | POA: Diagnosis not present

## 2015-08-28 DIAGNOSIS — L570 Actinic keratosis: Secondary | ICD-10-CM | POA: Diagnosis not present

## 2015-08-28 DIAGNOSIS — Z85828 Personal history of other malignant neoplasm of skin: Secondary | ICD-10-CM | POA: Diagnosis not present

## 2015-08-30 ENCOUNTER — Ambulatory Visit (INDEPENDENT_AMBULATORY_CARE_PROVIDER_SITE_OTHER): Payer: Medicare Other | Admitting: Pharmacist

## 2015-08-30 DIAGNOSIS — I482 Chronic atrial fibrillation, unspecified: Secondary | ICD-10-CM

## 2015-08-30 DIAGNOSIS — Z7901 Long term (current) use of anticoagulants: Secondary | ICD-10-CM | POA: Diagnosis not present

## 2015-08-30 LAB — POCT INR: INR: 2

## 2015-09-27 ENCOUNTER — Ambulatory Visit (INDEPENDENT_AMBULATORY_CARE_PROVIDER_SITE_OTHER): Payer: Medicare Other | Admitting: *Deleted

## 2015-09-27 DIAGNOSIS — I482 Chronic atrial fibrillation, unspecified: Secondary | ICD-10-CM

## 2015-09-27 DIAGNOSIS — Z7901 Long term (current) use of anticoagulants: Secondary | ICD-10-CM | POA: Diagnosis not present

## 2015-09-27 LAB — POCT INR: INR: 2.5

## 2015-10-25 ENCOUNTER — Ambulatory Visit (INDEPENDENT_AMBULATORY_CARE_PROVIDER_SITE_OTHER): Payer: Medicare Other | Admitting: *Deleted

## 2015-10-25 DIAGNOSIS — Z7901 Long term (current) use of anticoagulants: Secondary | ICD-10-CM | POA: Diagnosis not present

## 2015-10-25 DIAGNOSIS — I482 Chronic atrial fibrillation, unspecified: Secondary | ICD-10-CM

## 2015-10-25 LAB — POCT INR: INR: 2.1

## 2015-11-01 DIAGNOSIS — J449 Chronic obstructive pulmonary disease, unspecified: Secondary | ICD-10-CM | POA: Diagnosis not present

## 2015-11-01 DIAGNOSIS — I129 Hypertensive chronic kidney disease with stage 1 through stage 4 chronic kidney disease, or unspecified chronic kidney disease: Secondary | ICD-10-CM | POA: Diagnosis not present

## 2015-11-01 DIAGNOSIS — M109 Gout, unspecified: Secondary | ICD-10-CM | POA: Diagnosis not present

## 2015-11-01 DIAGNOSIS — I5030 Unspecified diastolic (congestive) heart failure: Secondary | ICD-10-CM | POA: Diagnosis not present

## 2015-11-20 DIAGNOSIS — C61 Malignant neoplasm of prostate: Secondary | ICD-10-CM | POA: Diagnosis not present

## 2015-11-28 DIAGNOSIS — L821 Other seborrheic keratosis: Secondary | ICD-10-CM | POA: Diagnosis not present

## 2015-11-28 DIAGNOSIS — L304 Erythema intertrigo: Secondary | ICD-10-CM | POA: Diagnosis not present

## 2015-11-28 DIAGNOSIS — Z85828 Personal history of other malignant neoplasm of skin: Secondary | ICD-10-CM | POA: Diagnosis not present

## 2015-11-28 DIAGNOSIS — L820 Inflamed seborrheic keratosis: Secondary | ICD-10-CM | POA: Diagnosis not present

## 2015-11-28 DIAGNOSIS — L570 Actinic keratosis: Secondary | ICD-10-CM | POA: Diagnosis not present

## 2015-11-28 DIAGNOSIS — D485 Neoplasm of uncertain behavior of skin: Secondary | ICD-10-CM | POA: Diagnosis not present

## 2015-12-02 DIAGNOSIS — Z8551 Personal history of malignant neoplasm of bladder: Secondary | ICD-10-CM | POA: Diagnosis not present

## 2015-12-02 DIAGNOSIS — C61 Malignant neoplasm of prostate: Secondary | ICD-10-CM | POA: Diagnosis not present

## 2015-12-06 ENCOUNTER — Ambulatory Visit (INDEPENDENT_AMBULATORY_CARE_PROVIDER_SITE_OTHER): Payer: Medicare Other | Admitting: Pharmacist

## 2015-12-06 DIAGNOSIS — Z7901 Long term (current) use of anticoagulants: Secondary | ICD-10-CM | POA: Diagnosis not present

## 2015-12-06 DIAGNOSIS — I482 Chronic atrial fibrillation, unspecified: Secondary | ICD-10-CM

## 2015-12-06 LAB — POCT INR: INR: 1.7

## 2016-01-08 ENCOUNTER — Encounter: Payer: Self-pay | Admitting: Interventional Cardiology

## 2016-01-14 ENCOUNTER — Telehealth: Payer: Self-pay | Admitting: Interventional Cardiology

## 2016-01-14 DIAGNOSIS — Z5181 Encounter for therapeutic drug level monitoring: Secondary | ICD-10-CM

## 2016-01-14 DIAGNOSIS — I4891 Unspecified atrial fibrillation: Secondary | ICD-10-CM

## 2016-01-14 NOTE — Telephone Encounter (Signed)
Called spoke with pt, advised per Dr Tamala Julian ok to switch from Coumadin to Eliquis.  Advised we would need to have him come in for labwork to check kidney function prior to rx dosage of Eliquis because his kidney function will help determine which dosage of Eliquis is appropriate for him.  Pt states he is coming in on Friday 01/17/16 to have INR checked, will check BMP at that time as well and work to transition him off Coumadin and onto Eliquis.  Pt verbalized understanding of plan.

## 2016-01-14 NOTE — Telephone Encounter (Signed)
New message        Pt C/o medication issue:  1. Name of Medication: coumadin  2. How are you currently taking this medication (dosage and times per day)? 3 mg po 3 daily except Thursday the pt takes a half  3. Are you having a reaction (difficulty breathing--STAT)? no  4. What is your medication issue? The pt would like to switched to something else where he doesn't have to come out every 2 weeks for a blood draw the pt states it very difficult for him to get out and go.

## 2016-01-14 NOTE — Telephone Encounter (Signed)
Will route telephone message to MD to address if switching to NOAC is appropriate or advisable per MD.

## 2016-01-14 NOTE — Telephone Encounter (Signed)
Switch to Eliquis either 2.5 mg BID or 5 mg BID based on kidney function. Needs BMET.

## 2016-01-17 ENCOUNTER — Encounter (INDEPENDENT_AMBULATORY_CARE_PROVIDER_SITE_OTHER): Payer: Self-pay

## 2016-01-17 ENCOUNTER — Ambulatory Visit (INDEPENDENT_AMBULATORY_CARE_PROVIDER_SITE_OTHER): Payer: Medicare Other | Admitting: *Deleted

## 2016-01-17 DIAGNOSIS — I4891 Unspecified atrial fibrillation: Secondary | ICD-10-CM

## 2016-01-17 DIAGNOSIS — I482 Chronic atrial fibrillation, unspecified: Secondary | ICD-10-CM

## 2016-01-17 DIAGNOSIS — Z5181 Encounter for therapeutic drug level monitoring: Secondary | ICD-10-CM

## 2016-01-17 DIAGNOSIS — Z7901 Long term (current) use of anticoagulants: Secondary | ICD-10-CM

## 2016-01-17 LAB — CBC
HEMATOCRIT: 39.6 % (ref 38.5–50.0)
Hemoglobin: 13 g/dL — ABNORMAL LOW (ref 13.2–17.1)
MCH: 32.3 pg (ref 27.0–33.0)
MCHC: 32.8 g/dL (ref 32.0–36.0)
MCV: 98.3 fL (ref 80.0–100.0)
MPV: 11 fL (ref 7.5–12.5)
PLATELETS: 229 10*3/uL (ref 140–400)
RBC: 4.03 MIL/uL — ABNORMAL LOW (ref 4.20–5.80)
RDW: 15.2 % — AB (ref 11.0–15.0)
WBC: 7.3 10*3/uL (ref 3.8–10.8)

## 2016-01-17 LAB — BASIC METABOLIC PANEL
BUN: 39 mg/dL — AB (ref 7–25)
CHLORIDE: 100 mmol/L (ref 98–110)
CO2: 23 mmol/L (ref 20–31)
Calcium: 8.9 mg/dL (ref 8.6–10.3)
Creat: 1.64 mg/dL — ABNORMAL HIGH (ref 0.70–1.11)
Glucose, Bld: 91 mg/dL (ref 65–99)
POTASSIUM: 4.9 mmol/L (ref 3.5–5.3)
SODIUM: 135 mmol/L (ref 135–146)

## 2016-01-17 LAB — POCT INR: INR: 2.7

## 2016-01-20 DIAGNOSIS — R05 Cough: Secondary | ICD-10-CM | POA: Diagnosis not present

## 2016-01-20 MED ORDER — APIXABAN 2.5 MG PO TABS: 2.5000 mg | ORAL_TABLET | Freq: Two times a day (BID) | ORAL | 11 refills | Status: DC

## 2016-02-03 DIAGNOSIS — Z0100 Encounter for examination of eyes and vision without abnormal findings: Secondary | ICD-10-CM | POA: Diagnosis not present

## 2016-02-03 DIAGNOSIS — H353131 Nonexudative age-related macular degeneration, bilateral, early dry stage: Secondary | ICD-10-CM | POA: Diagnosis not present

## 2016-02-03 DIAGNOSIS — H11003 Unspecified pterygium of eye, bilateral: Secondary | ICD-10-CM | POA: Diagnosis not present

## 2016-02-03 DIAGNOSIS — H43813 Vitreous degeneration, bilateral: Secondary | ICD-10-CM | POA: Diagnosis not present

## 2016-02-14 ENCOUNTER — Ambulatory Visit (INDEPENDENT_AMBULATORY_CARE_PROVIDER_SITE_OTHER): Payer: Medicare Other | Admitting: Pharmacist

## 2016-02-14 DIAGNOSIS — I482 Chronic atrial fibrillation, unspecified: Secondary | ICD-10-CM

## 2016-02-14 DIAGNOSIS — Z7901 Long term (current) use of anticoagulants: Secondary | ICD-10-CM | POA: Diagnosis not present

## 2016-02-14 LAB — CBC
HCT: 37.6 % — ABNORMAL LOW (ref 38.5–50.0)
HEMOGLOBIN: 12.4 g/dL — AB (ref 13.2–17.1)
MCH: 32.1 pg (ref 27.0–33.0)
MCHC: 33 g/dL (ref 32.0–36.0)
MCV: 97.4 fL (ref 80.0–100.0)
MPV: 11.7 fL (ref 7.5–12.5)
PLATELETS: 199 10*3/uL (ref 140–400)
RBC: 3.86 MIL/uL — AB (ref 4.20–5.80)
RDW: 15.8 % — AB (ref 11.0–15.0)
WBC: 6.5 10*3/uL (ref 3.8–10.8)

## 2016-02-14 LAB — BASIC METABOLIC PANEL
BUN: 33 mg/dL — ABNORMAL HIGH (ref 7–25)
CALCIUM: 9.1 mg/dL (ref 8.6–10.3)
CO2: 25 mmol/L (ref 20–31)
CREATININE: 1.56 mg/dL — AB (ref 0.70–1.11)
Chloride: 104 mmol/L (ref 98–110)
GLUCOSE: 89 mg/dL (ref 65–99)
Potassium: 4.5 mmol/L (ref 3.5–5.3)
SODIUM: 140 mmol/L (ref 135–146)

## 2016-02-14 NOTE — Progress Notes (Signed)
Pt was started on Eliquis 2.5 for Atrial Fibrillation on 01/20/16  Reviewed patients medication list.  Pt is not currently on any combined P-gp and strong CYP3A4 inhibitors/inducers (ketoconazole, traconazole, ritonavir, carbamazepine, phenytoin, rifampin, St. John's wort).  Reviewed labs.  SCr 1.56 Weight 112 kg, CrCl- 49 mL/min.  Dose appropriate based on age, weight, and SCr.  Hgb and HCT 12/4 and 37.6- stable from baseline labs  A full discussion of the nature of anticoagulants has been carried out.  A benefit/risk analysis has been presented to the patient, so that they understand the justification for choosing anticoagulation with Eliquis at this time.  The need for compliance is stressed.  Pt is aware to take the medication twice daily.  Side effects of potential bleeding are discussed, including unusual colored urine or stools, coughing up blood or coffee ground emesis, nose bleeds or serious fall or head trauma.  Discussed signs and symptoms of stroke. The patient should avoid any OTC items containing aspirin or ibuprofen.  Avoid alcohol consumption.   Call if any signs of abnormal bleeding.  Discussed financial obligations and resolved any difficulty in obtaining medication.  Next lab test test in 6 months.

## 2016-03-17 DIAGNOSIS — Z23 Encounter for immunization: Secondary | ICD-10-CM | POA: Diagnosis not present

## 2016-03-20 ENCOUNTER — Other Ambulatory Visit: Payer: Self-pay | Admitting: Interventional Cardiology

## 2016-03-26 ENCOUNTER — Ambulatory Visit (INDEPENDENT_AMBULATORY_CARE_PROVIDER_SITE_OTHER): Payer: Medicare Other | Admitting: Interventional Cardiology

## 2016-03-26 ENCOUNTER — Encounter: Payer: Self-pay | Admitting: Interventional Cardiology

## 2016-03-26 VITALS — BP 130/66 | HR 72 | Ht 72.0 in | Wt 237.8 lb

## 2016-03-26 DIAGNOSIS — R0602 Shortness of breath: Secondary | ICD-10-CM | POA: Diagnosis not present

## 2016-03-26 DIAGNOSIS — I482 Chronic atrial fibrillation, unspecified: Secondary | ICD-10-CM

## 2016-03-26 DIAGNOSIS — R0601 Orthopnea: Secondary | ICD-10-CM

## 2016-03-26 DIAGNOSIS — Z7901 Long term (current) use of anticoagulants: Secondary | ICD-10-CM

## 2016-03-26 DIAGNOSIS — I10 Essential (primary) hypertension: Secondary | ICD-10-CM | POA: Diagnosis not present

## 2016-03-26 NOTE — Progress Notes (Signed)
Cardiology Office Note    Date:  03/26/2016   ID:  Vincent Foster, DOB Sep 07, 1923, MRN [id number]  PCP:  Vincent Shelling, MD  Cardiologist: Vincent Grooms, MD   Chief Complaint  Patient presents with  . Atrial Fibrillation    History of Present Illness:  Vincent Foster is a 82 y.o. male who presents for who has COPD, history of hypertension, chronic atrial fibrillation, and chronic anticoagulation therapy on Coumadin  He complains of two-pillow orthopnea. He has had difficulty with dyspnea on exertion. No chest pain.  He awakens frequently with urination. He wonders if he can come off of his diuretic or at least have the dose decreased. He has frequent small-volume urination especially at night.   Past Medical History:  Diagnosis Date  . Adenocarcinoma of prostate ([place])   . Allergy   . Atrial fibrillation ([place])   . Basal cell carcinoma   . CKD (chronic kidney disease), stage III   . Glaucoma   . Gout   . History of vertigo   . Hyperlipidemia   . Hypertension   . IFG (impaired fasting glucose)   . Venous stasis    Lasix PRN.    Past Surgical History:  Procedure Laterality Date  . APPENDECTOMY    . COLON SURGERY    . CYSTOSCOPY/RETROGRADE/URETEROSCOPY N/A 08/08/2015   Procedure: CYSTOSCOPY WITH BILATERAL  RETROGRADE  [id number];  Surgeon: Vincent Seal, MD;  Location: [facility];  Service: Urology;  Laterality: N/A;  . PROSTATE SURGERY     Had gold seed placement  . TONSILECTOMY, ADENOIDECTOMY, BILATERAL MYRINGOTOMY AND TUBES    . TRANSURETHRAL RESECTION OF BLADDER TUMOR N/A 08/08/2015   Procedure: TRANSURETHRAL RESECTION OF BLADDER TUMOR (TURBT) ;  Surgeon: Vincent Seal, MD;  Location: [facility];  Service: Urology;  Laterality: N/A;    Current Medications: Outpatient Medications Prior to Visit  Medication Sig Dispense Refill  . allopurinol (ZYLOPRIM) 100 MG tablet Take 200 mg by mouth daily.    Marland Kitchen apixaban (ELIQUIS) 2.5 MG TABS tablet Take 1 tablet (2.5 mg total)  by mouth 2 (two) times daily. 60 tablet 11  . cholecalciferol (VITAMIN D) 1000 units tablet Take 1,000 Units by mouth daily.    Marland Kitchen losartan (COZAAR) 50 MG tablet Take 1 tablet (50 mg total) by mouth daily. 90 tablet 3  . metoprolol (LOPRESSOR) 50 MG tablet Take 1 tablet (50 mg total) by mouth 2 (two) times daily. 180 tablet 0  . potassium chloride (K-DUR) 10 MEQ tablet Take 10 mEq by mouth 2 (two) times daily.    . traMADol-acetaminophen (ULTRACET) 37.5-325 MG tablet Take 1 tablet by mouth every 6 (six) hours as needed. 12 tablet 0  . triamcinolone (NASACORT) 55 MCG/ACT AERO nasal inhaler Place 2 sprays into the nose at bedtime as needed (for congestion).     Marland Kitchen albuterol (PROVENTIL HFA;VENTOLIN HFA) 108 (90 BASE) MCG/ACT inhaler Inhale into the lungs every 6 (six) hours as needed for wheezing or shortness of breath.    . hydroxypropyl methylcellulose / hypromellose (ISOPTO TEARS / GONIOVISC) 2.5 % ophthalmic solution Place 1-2 drops into both eyes daily as needed for dry eyes.    . Multiple Vitamins-Minerals (OCUVITE PRESERVISION PO) Take 1 tablet by mouth 2 (two) times daily.     No facility-administered medications prior to visit.      Allergies:   Bee venom and Tetanus toxoids   Social History   Social History  . Marital status: Married  Spouse name: N/A  . Number of children: N/A  . Years of education: N/A   Social History Main Topics  . Smoking status: Former Research scientist (life sciences)  . Smokeless tobacco: Former Systems developer    Types: Chew  . Alcohol use None  . Drug use: No  . Sexual activity: Not Asked   Other Topics Concern  . None   Social History Narrative  . None     Family History:  The patient's family history includes Colon cancer in his sister; Dementia in his sister; Healthy in his mother; Pneumonia in his father.   ROS:   Please see the history of present illness.    Occasional diarrhea. Bladder tumor removed by Dr. Jeffie Foster. Enjoying eating off Coumadin although concerned about the  expense of eloquent's.  All other systems reviewed and are negative.   PHYSICAL EXAM:   VS:  BP 130/66   Pulse 72   Ht 6' (1.829 m)   Wt 237 lb 12.8 oz (107.9 kg)   BMI 32.25 kg/m    GEN: Well nourished, well developed, in no acute distress  HEENT: normal  Neck: no JVD, carotid bruits, or masses Cardiac: RRR; no murmurs, rubs, or gallops,no edema  Respiratory:  clear to auscultation bilaterally, normal work of breathing GI: soft, nontender, nondistended, + BS MS: no deformity or atrophy  Skin: warm and dry, no rash Neuro:  Alert and Oriented x 3, Strength and sensation are intact Psych: euthymic mood, full affect  Wt Readings from Last 3 Encounters:  03/26/16 237 lb 12.8 oz (107.9 kg)  08/08/15 235 lb 8 oz (106.8 kg)  03/22/15 247 lb 6.4 oz (112.2 kg)      Studies/Labs Reviewed:   EKG:  EKG  Atrial fib, right bundle branch block, PVCs, no change compared to the prior tracing. Probable prior septal infarct.  Recent Labs: 02/14/2016: BUN 33; Creat 1.56; Hemoglobin 12.4; Platelets 199; Potassium 4.5; Sodium 140   Lipid Panel No results found for: CHOL, TRIG, HDL, CHOLHDL, VLDL, LDLCALC, LDLDIRECT  Additional studies/ records that were reviewed today include:  No new cardiac data.    ASSESSMENT:    1. Chronic atrial fibrillation ([place])   2. Essential hypertension   3. Chronic anticoagulation   4. SOB (shortness of breath)   5. Orthopnea      PLAN:  In order of problems listed above:  1. Controlled rate. No particular complaints. 2. Blood pressures well controlled on the current medical regimen. 3. No bleeding on low-dose eloquent's. 4. He is concerned about shortness of breath and orthopnea but on the other hand his diuretic regimen decreased because of frequent nocturnal urination. I will repeat a basic metabolic panel. Also need a BNP to exclude decompensated diastolic heart failure. His BNP is extremely high we will not be able to decrease the diuretic  intensity. If it is normal we may decrease it by 30-60%.    Medication Adjustments/Labs and Tests Ordered: Current medicines are reviewed at length with the patient today.  Concerns regarding medicines are outlined above.  Medication changes, Labs and Tests ordered today are listed in the Patient Instructions below. Patient Instructions  Medication Instructions:  None  Labwork: BNP and BMET today  Testing/Procedures: None  Follow-Up: Your physician wants you to follow-up in: 1 year with Dr. Tamala Julian.  You will receive a reminder letter in the mail two months in advance. If you don't receive a letter, please call our office to schedule the follow-up appointment.   Any Other Special  Instructions Will Be Listed Below (If Applicable).     If you need a refill on your cardiac medications before your next appointment, please call your pharmacy.      Signed, Vincent Grooms, MD  03/26/2016 3:00 PM    [address], [place], [place]  [address] Phone: [phone]; Fax: [phone]

## 2016-03-26 NOTE — Patient Instructions (Signed)
Medication Instructions:  None  Labwork: BNP and BMET today  Testing/Procedures: None  Follow-Up: Your physician wants you to follow-up in: 1 year with Dr. Tamala Julian.  You will receive a reminder letter in the mail two months in advance. If you don't receive a letter, please call our office to schedule the follow-up appointment.   Any Other Special Instructions Will Be Listed Below (If Applicable).     If you need a refill on your cardiac medications before your next appointment, please call your pharmacy.

## 2016-03-27 ENCOUNTER — Other Ambulatory Visit: Payer: Self-pay | Admitting: Interventional Cardiology

## 2016-03-27 LAB — BRAIN NATRIURETIC PEPTIDE: BRAIN NATRIURETIC PEPTIDE: 235.9 pg/mL — AB (ref <100)

## 2016-03-27 LAB — BASIC METABOLIC PANEL
BUN: 39 mg/dL — ABNORMAL HIGH (ref 7–25)
CALCIUM: 9.2 mg/dL (ref 8.6–10.3)
CO2: 24 mmol/L (ref 20–31)
Chloride: 98 mmol/L (ref 98–110)
Creat: 1.8 mg/dL — ABNORMAL HIGH (ref 0.70–1.11)
Glucose, Bld: 80 mg/dL (ref 65–99)
POTASSIUM: 4.6 mmol/L (ref 3.5–5.3)
SODIUM: 136 mmol/L (ref 135–146)

## 2016-03-30 ENCOUNTER — Telehealth: Payer: Self-pay | Admitting: *Deleted

## 2016-03-30 DIAGNOSIS — R0602 Shortness of breath: Secondary | ICD-10-CM

## 2016-03-30 DIAGNOSIS — R7989 Other specified abnormal findings of blood chemistry: Secondary | ICD-10-CM

## 2016-03-30 MED ORDER — FUROSEMIDE 20 MG PO TABS: 40.0000 mg | ORAL_TABLET | Freq: Every day | ORAL | 3 refills | Status: DC

## 2016-03-30 NOTE — Telephone Encounter (Signed)
Advised pt of lab results and new orders.  Pt verbalized understanding and was in agreement with this plan. Pt will have labs drawn on 10/30.

## 2016-03-30 NOTE — Telephone Encounter (Signed)
-----   Message from Belva Crome, MD sent at 03/27/2016  5:47 PM EDT ----- Let the patient know kidney function is worse, possibly related to dose of Lasix. Decrease to 40 mg (two 20 mg tablets) daily. BMET and BNP in 3 weeks. A copy will be sent to Irven Shelling, MD

## 2016-03-31 DIAGNOSIS — Z23 Encounter for immunization: Secondary | ICD-10-CM | POA: Diagnosis not present

## 2016-04-20 ENCOUNTER — Other Ambulatory Visit: Payer: Medicare Other | Admitting: *Deleted

## 2016-04-20 DIAGNOSIS — R7989 Other specified abnormal findings of blood chemistry: Secondary | ICD-10-CM | POA: Diagnosis not present

## 2016-04-21 LAB — BASIC METABOLIC PANEL
BUN: 30 mg/dL — AB (ref 7–25)
CHLORIDE: 100 mmol/L (ref 98–110)
CO2: 28 mmol/L (ref 20–31)
CREATININE: 1.47 mg/dL — AB (ref 0.70–1.11)
Calcium: 9.2 mg/dL (ref 8.6–10.3)
GLUCOSE: 83 mg/dL (ref 65–99)
POTASSIUM: 4.8 mmol/L (ref 3.5–5.3)
Sodium: 137 mmol/L (ref 135–146)

## 2016-04-27 DIAGNOSIS — D485 Neoplasm of uncertain behavior of skin: Secondary | ICD-10-CM | POA: Diagnosis not present

## 2016-04-27 DIAGNOSIS — D1801 Hemangioma of skin and subcutaneous tissue: Secondary | ICD-10-CM | POA: Diagnosis not present

## 2016-04-27 DIAGNOSIS — C4442 Squamous cell carcinoma of skin of scalp and neck: Secondary | ICD-10-CM | POA: Diagnosis not present

## 2016-04-27 DIAGNOSIS — L570 Actinic keratosis: Secondary | ICD-10-CM | POA: Diagnosis not present

## 2016-04-27 DIAGNOSIS — Z85828 Personal history of other malignant neoplasm of skin: Secondary | ICD-10-CM | POA: Diagnosis not present

## 2016-04-27 DIAGNOSIS — C44229 Squamous cell carcinoma of skin of left ear and external auricular canal: Secondary | ICD-10-CM | POA: Diagnosis not present

## 2016-04-27 DIAGNOSIS — L821 Other seborrheic keratosis: Secondary | ICD-10-CM | POA: Diagnosis not present

## 2016-05-04 DIAGNOSIS — I5030 Unspecified diastolic (congestive) heart failure: Secondary | ICD-10-CM | POA: Diagnosis not present

## 2016-05-04 DIAGNOSIS — Z1389 Encounter for screening for other disorder: Secondary | ICD-10-CM | POA: Diagnosis not present

## 2016-05-04 DIAGNOSIS — I129 Hypertensive chronic kidney disease with stage 1 through stage 4 chronic kidney disease, or unspecified chronic kidney disease: Secondary | ICD-10-CM | POA: Diagnosis not present

## 2016-05-07 DIAGNOSIS — Z85828 Personal history of other malignant neoplasm of skin: Secondary | ICD-10-CM | POA: Diagnosis not present

## 2016-05-07 DIAGNOSIS — C4442 Squamous cell carcinoma of skin of scalp and neck: Secondary | ICD-10-CM | POA: Diagnosis not present

## 2016-05-20 ENCOUNTER — Emergency Department (HOSPITAL_COMMUNITY)
Admission: EM | Admit: 2016-05-20 | Discharge: 2016-05-20 | Disposition: A | Discharge status: Home / Self Care | Payer: Medicare Other | Attending: Emergency Medicine | Admitting: Emergency Medicine

## 2016-05-20 ENCOUNTER — Encounter (HOSPITAL_COMMUNITY): Payer: Self-pay | Admitting: Emergency Medicine

## 2016-05-20 DIAGNOSIS — H9541 Postprocedural hemorrhage and hematoma of ear and mastoid process following a procedure on the ear and mastoid process: Secondary | ICD-10-CM | POA: Diagnosis not present

## 2016-05-20 DIAGNOSIS — I129 Hypertensive chronic kidney disease with stage 1 through stage 4 chronic kidney disease, or unspecified chronic kidney disease: Secondary | ICD-10-CM | POA: Insufficient documentation

## 2016-05-20 DIAGNOSIS — Z7901 Long term (current) use of anticoagulants: Secondary | ICD-10-CM | POA: Insufficient documentation

## 2016-05-20 DIAGNOSIS — H9542 Postprocedural hemorrhage and hematoma of ear and mastoid process following other procedure: Secondary | ICD-10-CM | POA: Insufficient documentation

## 2016-05-20 DIAGNOSIS — N183 Chronic kidney disease, stage 3 (moderate): Secondary | ICD-10-CM | POA: Insufficient documentation

## 2016-05-20 DIAGNOSIS — Z87891 Personal history of nicotine dependence: Secondary | ICD-10-CM | POA: Insufficient documentation

## 2016-05-20 DIAGNOSIS — H9222 Otorrhagia, left ear: Secondary | ICD-10-CM

## 2016-05-20 DIAGNOSIS — Z85828 Personal history of other malignant neoplasm of skin: Secondary | ICD-10-CM | POA: Diagnosis not present

## 2016-05-20 DIAGNOSIS — J449 Chronic obstructive pulmonary disease, unspecified: Secondary | ICD-10-CM | POA: Insufficient documentation

## 2016-05-20 DIAGNOSIS — C44229 Squamous cell carcinoma of skin of left ear and external auricular canal: Secondary | ICD-10-CM | POA: Diagnosis not present

## 2016-05-20 NOTE — ED Notes (Signed)
Dry sterile dressing in place left ear--no bleeding upon d/c.

## 2016-05-20 NOTE — ED Triage Notes (Signed)
Pt reports had surgery for skin cancer removal near left earlobe. Pt reports heard a pop, presents with active bleeding , holding pressure. Pt takes Eliquis for Afib. Pt alert and oriented x 4. Denies pain.

## 2016-05-20 NOTE — ED Provider Notes (Signed)
[facility] DEPT Provider Note   CSN: [id number] Arrival date & time: 05/20/16  1726     History   Chief Complaint Chief Complaint  Patient presents with  . Ear Laceration    HPI Vincent Foster is a 82 y.o. male.  HPI Patient underwent surgery for skin cancer in his left ear today by the Mohs surgeon Dr. Sarajane Jews.  He is on helical request.  4-5 hours after his procedure he began having bleeding from the recent surgical wound.  He comes the ER for evaluation with active bleeding.   Past Medical History:  Diagnosis Date  . Adenocarcinoma of prostate ([place])   . Allergy   . Atrial fibrillation ([place])   . Basal cell carcinoma   . CKD (chronic kidney disease), stage III   . Glaucoma   . Gout   . History of vertigo   . Hyperlipidemia   . Hypertension   . IFG (impaired fasting glucose)   . Venous stasis    Lasix PRN.    Patient Active Problem List   Diagnosis Date Noted  . Essential hypertension 03/06/2014  . Chronic atrial fibrillation ([place]) 03/06/2014  . COPD bronchitis 03/06/2014  . Chronic anticoagulation 03/06/2014    Past Surgical History:  Procedure Laterality Date  . APPENDECTOMY    . COLON SURGERY    . CYSTOSCOPY/RETROGRADE/URETEROSCOPY N/A 08/08/2015   Procedure: CYSTOSCOPY WITH BILATERAL  RETROGRADE  [id number];  Surgeon: Irine Seal, MD;  Location: [facility];  Service: Urology;  Laterality: N/A;  . PROSTATE SURGERY     Had gold seed placement  . TONSILECTOMY, ADENOIDECTOMY, BILATERAL MYRINGOTOMY AND TUBES    . TRANSURETHRAL RESECTION OF BLADDER TUMOR N/A 08/08/2015   Procedure: TRANSURETHRAL RESECTION OF BLADDER TUMOR (TURBT) ;  Surgeon: Irine Seal, MD;  Location: [facility];  Service: Urology;  Laterality: N/A;       Home Medications    Prior to Admission medications   Medication Sig Start Date End Date Taking? Authorizing Provider  allopurinol (ZYLOPRIM) 100 MG tablet Take 200 mg by mouth daily.    Historical Provider, MD  apixaban (ELIQUIS) 2.5 MG  TABS tablet Take 1 tablet (2.5 mg total) by mouth 2 (two) times daily. 01/20/16   Belva Crome, MD  Carboxymeth-Glycerin-Polysorb (REFRESH OPTIVE ADVANCED) 0.5-1-0.5 % SOLN Place 2 drops into both eyes daily as needed (dry eyes).    Historical Provider, MD  cholecalciferol (VITAMIN D) 1000 units tablet Take 1,000 Units by mouth daily.    Historical Provider, MD  furosemide (LASIX) 20 MG tablet Take 2 tablets (40 mg total) by mouth daily. 03/30/16   Belva Crome, MD  GuaiFENesin (MUCINEX PO) Take 1 tablet by mouth at bedtime as needed (chest congestion).    Historical Provider, MD  losartan (COZAAR) 50 MG tablet Take 1 tablet (50 mg total) by mouth daily. 03/27/16   Belva Crome, MD  metoprolol (LOPRESSOR) 50 MG tablet Take 1 tablet (50 mg total) by mouth 2 (two) times daily. 03/20/16   Belva Crome, MD  Multiple Vitamins-Minerals (PRESERVISION AREDS) CAPS Take 1 capsule by mouth daily.    Historical Provider, MD  potassium chloride (K-DUR) 10 MEQ tablet Take 10 mEq by mouth 2 (two) times daily.    Historical Provider, MD  traMADol-acetaminophen (ULTRACET) 37.5-325 MG tablet Take 1 tablet by mouth every 6 (six) hours as needed. 08/08/15   Irine Seal, MD  triamcinolone (NASACORT) 55 MCG/ACT AERO nasal inhaler Place 2 sprays into the nose at bedtime as  needed (for congestion).     Historical Provider, MD    Family History Family History  Problem Relation Age of Onset  . Healthy Mother   . Pneumonia Father   . Colon cancer Sister   . Dementia Sister     Social History Social History  Substance Use Topics  . Smoking status: Former Research scientist (life sciences)  . Smokeless tobacco: Former Systems developer    Types: Chew  . Alcohol use Not on file     Allergies   Bee venom and Tetanus toxoids   Review of Systems Review of Systems  All other systems reviewed and are negative.    Physical Exam Updated Vital Signs BP 168/84 (BP Location: Right Arm)   Pulse 75   Temp 97.8 F (36.6 C) (Oral)   Resp 16   SpO2 100%     Physical Exam  Constitutional: He is oriented to person, place, and time. He appears well-developed and well-nourished.  HENT:  Small amount of active bleeding at the surgical wound of the left antihelix and concha.  Controlled with direct pressure.  Eyes: EOM are normal.  Neck: Normal range of motion.  Pulmonary/Chest: Effort normal.  Abdominal: He exhibits no distension.  Musculoskeletal: Normal range of motion.  Neurological: He is alert and oriented to person, place, and time.  Psychiatric: He has a normal mood and affect.  Nursing note and vitals reviewed.    ED Treatments / Results  Labs (all labs ordered are listed, but only abnormal results are displayed) Labs Reviewed - No data to display  EKG  EKG Interpretation None       Radiology No results found.  Procedures Procedures (including critical care time)  Medications Ordered in ED Medications - No data to display   Initial Impression / Assessment and Plan / ED Course  I have reviewed the triage vital signs and the nursing notes.  Pertinent labs & imaging results that were available during my care of the patient were reviewed by me and considered in my medical decision making (see chart for details).  Clinical Course     Bleeding controlled with direct pressure.  Bolster dressing applied.  No active bleeding.  Patient will follow-up with his Mohs surgeon in the morning.  He understands return to the ER for new or worsening symptoms.  Final Clinical Impressions(s) / ED Diagnoses   Final diagnoses:  Bleeding from ear, left    New Prescriptions New Prescriptions   No medications on file     Jola Schmidt, MD 05/20/16 1900

## 2016-05-20 NOTE — ED Notes (Signed)
Dr. Venora Maples has removed the surgical dressing from the left ear pinna and is holding pressure on the incision site. He tells [place] he had had a skin cancer removal surgery today by Dr. Sarajane Jews (Dermatology).

## 2016-05-21 ENCOUNTER — Telehealth: Payer: Self-pay | Admitting: Interventional Cardiology

## 2016-05-21 NOTE — Telephone Encounter (Signed)
New Message:    Pt is in the doctor's office now. He needs to know if the pt can stop his Eliquis?

## 2016-05-21 NOTE — Telephone Encounter (Signed)
Pt son Randon Goldsmith, calling as he with pt at ENT office. They want to HOLD pt Eliquis as he is having some bleeding in his ear. Pt told would have to forward to provider and we will get back to him.  Verbalized understanding and asked we call him back on cell [phone] as soon as we possible.

## 2016-05-21 NOTE — Telephone Encounter (Signed)
Spoke with son [place], [place] on file, and advised him of recommendations per Dr. Tamala Julian.  Ben verbalized understanding and was in agreement with this plan.

## 2016-05-21 NOTE — Telephone Encounter (Signed)
It is okay to discontinue Elioquis until the problem resolves. He should call us back if he is off the medication for longer than 5 days.

## 2016-06-01 DIAGNOSIS — C61 Malignant neoplasm of prostate: Secondary | ICD-10-CM | POA: Diagnosis not present

## 2016-06-08 DIAGNOSIS — N3040 Irradiation cystitis without hematuria: Secondary | ICD-10-CM | POA: Diagnosis not present

## 2016-06-08 DIAGNOSIS — Z8551 Personal history of malignant neoplasm of bladder: Secondary | ICD-10-CM | POA: Diagnosis not present

## 2016-06-08 DIAGNOSIS — C61 Malignant neoplasm of prostate: Secondary | ICD-10-CM | POA: Diagnosis not present

## 2016-06-13 ENCOUNTER — Other Ambulatory Visit: Payer: Self-pay | Admitting: Interventional Cardiology

## 2016-07-15 ENCOUNTER — Other Ambulatory Visit: Payer: Self-pay | Admitting: Internal Medicine

## 2016-07-15 ENCOUNTER — Ambulatory Visit
Admission: RE | Admit: 2016-07-15 | Discharge: 2016-07-15 | Disposition: A | Discharge status: Home / Self Care | Payer: Medicare Other | Source: Ambulatory Visit | Attending: Internal Medicine | Admitting: Internal Medicine

## 2016-07-15 DIAGNOSIS — R531 Weakness: Secondary | ICD-10-CM | POA: Diagnosis not present

## 2016-07-15 DIAGNOSIS — R05 Cough: Secondary | ICD-10-CM

## 2016-07-15 DIAGNOSIS — J101 Influenza due to other identified influenza virus with other respiratory manifestations: Secondary | ICD-10-CM | POA: Diagnosis not present

## 2016-07-15 DIAGNOSIS — R059 Cough, unspecified: Secondary | ICD-10-CM

## 2016-07-15 DIAGNOSIS — J181 Lobar pneumonia, unspecified organism: Secondary | ICD-10-CM | POA: Diagnosis not present

## 2016-07-15 DIAGNOSIS — N183 Chronic kidney disease, stage 3 (moderate): Secondary | ICD-10-CM | POA: Diagnosis not present

## 2016-07-21 DIAGNOSIS — J181 Lobar pneumonia, unspecified organism: Secondary | ICD-10-CM | POA: Diagnosis not present

## 2016-07-21 DIAGNOSIS — I5030 Unspecified diastolic (congestive) heart failure: Secondary | ICD-10-CM | POA: Diagnosis not present

## 2016-07-21 DIAGNOSIS — J111 Influenza due to unidentified influenza virus with other respiratory manifestations: Secondary | ICD-10-CM | POA: Diagnosis not present

## 2016-08-05 DIAGNOSIS — C4442 Squamous cell carcinoma of skin of scalp and neck: Secondary | ICD-10-CM | POA: Diagnosis not present

## 2016-08-05 DIAGNOSIS — D0439 Carcinoma in situ of skin of other parts of face: Secondary | ICD-10-CM | POA: Diagnosis not present

## 2016-08-05 DIAGNOSIS — C4441 Basal cell carcinoma of skin of scalp and neck: Secondary | ICD-10-CM | POA: Diagnosis not present

## 2016-08-05 DIAGNOSIS — Z85828 Personal history of other malignant neoplasm of skin: Secondary | ICD-10-CM | POA: Diagnosis not present

## 2016-08-05 DIAGNOSIS — L570 Actinic keratosis: Secondary | ICD-10-CM | POA: Diagnosis not present

## 2016-08-05 DIAGNOSIS — D485 Neoplasm of uncertain behavior of skin: Secondary | ICD-10-CM | POA: Diagnosis not present

## 2016-10-13 ENCOUNTER — Ambulatory Visit: Payer: Self-pay | Admitting: Pharmacist

## 2016-11-02 DIAGNOSIS — I4891 Unspecified atrial fibrillation: Secondary | ICD-10-CM | POA: Diagnosis not present

## 2016-11-02 DIAGNOSIS — I129 Hypertensive chronic kidney disease with stage 1 through stage 4 chronic kidney disease, or unspecified chronic kidney disease: Secondary | ICD-10-CM | POA: Diagnosis not present

## 2016-11-02 DIAGNOSIS — J449 Chronic obstructive pulmonary disease, unspecified: Secondary | ICD-10-CM | POA: Diagnosis not present

## 2016-11-02 DIAGNOSIS — I5030 Unspecified diastolic (congestive) heart failure: Secondary | ICD-10-CM | POA: Diagnosis not present

## 2016-11-10 DIAGNOSIS — Z85828 Personal history of other malignant neoplasm of skin: Secondary | ICD-10-CM | POA: Diagnosis not present

## 2016-11-10 DIAGNOSIS — L821 Other seborrheic keratosis: Secondary | ICD-10-CM | POA: Diagnosis not present

## 2016-11-10 DIAGNOSIS — L570 Actinic keratosis: Secondary | ICD-10-CM | POA: Diagnosis not present

## 2016-11-30 DIAGNOSIS — C61 Malignant neoplasm of prostate: Secondary | ICD-10-CM | POA: Diagnosis not present

## 2016-12-09 DIAGNOSIS — C61 Malignant neoplasm of prostate: Secondary | ICD-10-CM | POA: Diagnosis not present

## 2016-12-09 DIAGNOSIS — Z8551 Personal history of malignant neoplasm of bladder: Secondary | ICD-10-CM | POA: Diagnosis not present

## 2016-12-09 DIAGNOSIS — N3040 Irradiation cystitis without hematuria: Secondary | ICD-10-CM | POA: Diagnosis not present

## 2017-01-04 ENCOUNTER — Ambulatory Visit
Admission: RE | Admit: 2017-01-04 | Discharge: 2017-01-04 | Disposition: A | Discharge status: Home / Self Care | Payer: Medicare Other | Source: Ambulatory Visit | Attending: Internal Medicine | Admitting: Internal Medicine

## 2017-01-04 ENCOUNTER — Other Ambulatory Visit: Payer: Self-pay | Admitting: Internal Medicine

## 2017-01-04 DIAGNOSIS — I5030 Unspecified diastolic (congestive) heart failure: Secondary | ICD-10-CM | POA: Diagnosis not present

## 2017-01-04 DIAGNOSIS — R0600 Dyspnea, unspecified: Secondary | ICD-10-CM

## 2017-01-04 DIAGNOSIS — J90 Pleural effusion, not elsewhere classified: Secondary | ICD-10-CM | POA: Diagnosis not present

## 2017-01-04 DIAGNOSIS — N183 Chronic kidney disease, stage 3 (moderate): Secondary | ICD-10-CM | POA: Diagnosis not present

## 2017-01-04 DIAGNOSIS — R39198 Other difficulties with micturition: Secondary | ICD-10-CM | POA: Diagnosis not present

## 2017-01-04 DIAGNOSIS — I4891 Unspecified atrial fibrillation: Secondary | ICD-10-CM | POA: Diagnosis not present

## 2017-01-10 ENCOUNTER — Other Ambulatory Visit: Payer: Self-pay | Admitting: Interventional Cardiology

## 2017-01-11 NOTE — Telephone Encounter (Signed)
Age 83 years Wt 107.9 kg  03/26/2016 Saw Dr Tamala Julian on 03/26/2017 04/20/2016 SrCr 1.47 02/14/2016  Hgb 12.4 HCT 37.6 Refill done for Eliquis 2.5mg  every 12 hours

## 2017-01-12 NOTE — Telephone Encounter (Deleted)
Saw Dr Tamala Julian 04/20/2016

## 2017-01-12 NOTE — Telephone Encounter (Signed)
Saw Dr Tamala Julian 03/26/2016

## 2017-01-18 DIAGNOSIS — R1312 Dysphagia, oropharyngeal phase: Secondary | ICD-10-CM | POA: Diagnosis not present

## 2017-01-18 DIAGNOSIS — I5030 Unspecified diastolic (congestive) heart failure: Secondary | ICD-10-CM | POA: Diagnosis not present

## 2017-02-04 DIAGNOSIS — H11003 Unspecified pterygium of eye, bilateral: Secondary | ICD-10-CM | POA: Diagnosis not present

## 2017-02-04 DIAGNOSIS — H524 Presbyopia: Secondary | ICD-10-CM | POA: Diagnosis not present

## 2017-02-04 DIAGNOSIS — Z961 Presence of intraocular lens: Secondary | ICD-10-CM | POA: Diagnosis not present

## 2017-02-04 DIAGNOSIS — H353131 Nonexudative age-related macular degeneration, bilateral, early dry stage: Secondary | ICD-10-CM | POA: Diagnosis not present

## 2017-03-08 DIAGNOSIS — I5030 Unspecified diastolic (congestive) heart failure: Secondary | ICD-10-CM | POA: Diagnosis not present

## 2017-03-09 DIAGNOSIS — Z8545 Personal history of malignant neoplasm of unspecified male genital organ: Secondary | ICD-10-CM | POA: Diagnosis not present

## 2017-03-09 DIAGNOSIS — Z87891 Personal history of nicotine dependence: Secondary | ICD-10-CM | POA: Diagnosis not present

## 2017-03-09 DIAGNOSIS — Z85828 Personal history of other malignant neoplasm of skin: Secondary | ICD-10-CM | POA: Diagnosis not present

## 2017-03-09 DIAGNOSIS — I130 Hypertensive heart and chronic kidney disease with heart failure and stage 1 through stage 4 chronic kidney disease, or unspecified chronic kidney disease: Secondary | ICD-10-CM | POA: Diagnosis not present

## 2017-03-09 DIAGNOSIS — J449 Chronic obstructive pulmonary disease, unspecified: Secondary | ICD-10-CM | POA: Diagnosis not present

## 2017-03-09 DIAGNOSIS — I5030 Unspecified diastolic (congestive) heart failure: Secondary | ICD-10-CM | POA: Diagnosis not present

## 2017-03-09 DIAGNOSIS — M109 Gout, unspecified: Secondary | ICD-10-CM | POA: Diagnosis not present

## 2017-03-09 DIAGNOSIS — I482 Chronic atrial fibrillation: Secondary | ICD-10-CM | POA: Diagnosis not present

## 2017-03-09 DIAGNOSIS — N183 Chronic kidney disease, stage 3 (moderate): Secondary | ICD-10-CM | POA: Diagnosis not present

## 2017-03-09 DIAGNOSIS — Z7901 Long term (current) use of anticoagulants: Secondary | ICD-10-CM | POA: Diagnosis not present

## 2017-03-09 DIAGNOSIS — R1312 Dysphagia, oropharyngeal phase: Secondary | ICD-10-CM | POA: Diagnosis not present

## 2017-03-09 DIAGNOSIS — Z7951 Long term (current) use of inhaled steroids: Secondary | ICD-10-CM | POA: Diagnosis not present

## 2017-03-10 DIAGNOSIS — I130 Hypertensive heart and chronic kidney disease with heart failure and stage 1 through stage 4 chronic kidney disease, or unspecified chronic kidney disease: Secondary | ICD-10-CM | POA: Diagnosis not present

## 2017-03-10 DIAGNOSIS — I5030 Unspecified diastolic (congestive) heart failure: Secondary | ICD-10-CM | POA: Diagnosis not present

## 2017-03-12 DIAGNOSIS — I5030 Unspecified diastolic (congestive) heart failure: Secondary | ICD-10-CM | POA: Diagnosis not present

## 2017-03-12 DIAGNOSIS — I130 Hypertensive heart and chronic kidney disease with heart failure and stage 1 through stage 4 chronic kidney disease, or unspecified chronic kidney disease: Secondary | ICD-10-CM | POA: Diagnosis not present

## 2017-03-16 DIAGNOSIS — Z23 Encounter for immunization: Secondary | ICD-10-CM | POA: Diagnosis not present

## 2017-03-16 DIAGNOSIS — Z79899 Other long term (current) drug therapy: Secondary | ICD-10-CM | POA: Diagnosis not present

## 2017-03-16 DIAGNOSIS — I5030 Unspecified diastolic (congestive) heart failure: Secondary | ICD-10-CM | POA: Diagnosis not present

## 2017-03-17 ENCOUNTER — Encounter: Payer: Self-pay | Admitting: Interventional Cardiology

## 2017-03-17 DIAGNOSIS — I5030 Unspecified diastolic (congestive) heart failure: Secondary | ICD-10-CM | POA: Diagnosis not present

## 2017-03-17 DIAGNOSIS — I130 Hypertensive heart and chronic kidney disease with heart failure and stage 1 through stage 4 chronic kidney disease, or unspecified chronic kidney disease: Secondary | ICD-10-CM | POA: Diagnosis not present

## 2017-03-18 DIAGNOSIS — I130 Hypertensive heart and chronic kidney disease with heart failure and stage 1 through stage 4 chronic kidney disease, or unspecified chronic kidney disease: Secondary | ICD-10-CM | POA: Diagnosis not present

## 2017-03-18 DIAGNOSIS — I5030 Unspecified diastolic (congestive) heart failure: Secondary | ICD-10-CM | POA: Diagnosis not present

## 2017-03-19 DIAGNOSIS — I130 Hypertensive heart and chronic kidney disease with heart failure and stage 1 through stage 4 chronic kidney disease, or unspecified chronic kidney disease: Secondary | ICD-10-CM | POA: Diagnosis not present

## 2017-03-19 DIAGNOSIS — I5030 Unspecified diastolic (congestive) heart failure: Secondary | ICD-10-CM | POA: Diagnosis not present

## 2017-03-22 DIAGNOSIS — I130 Hypertensive heart and chronic kidney disease with heart failure and stage 1 through stage 4 chronic kidney disease, or unspecified chronic kidney disease: Secondary | ICD-10-CM | POA: Diagnosis not present

## 2017-03-22 DIAGNOSIS — I5030 Unspecified diastolic (congestive) heart failure: Secondary | ICD-10-CM | POA: Diagnosis not present

## 2017-03-24 DIAGNOSIS — I130 Hypertensive heart and chronic kidney disease with heart failure and stage 1 through stage 4 chronic kidney disease, or unspecified chronic kidney disease: Secondary | ICD-10-CM | POA: Diagnosis not present

## 2017-03-24 DIAGNOSIS — I5030 Unspecified diastolic (congestive) heart failure: Secondary | ICD-10-CM | POA: Diagnosis not present

## 2017-03-25 DIAGNOSIS — I130 Hypertensive heart and chronic kidney disease with heart failure and stage 1 through stage 4 chronic kidney disease, or unspecified chronic kidney disease: Secondary | ICD-10-CM | POA: Diagnosis not present

## 2017-03-25 DIAGNOSIS — I5030 Unspecified diastolic (congestive) heart failure: Secondary | ICD-10-CM | POA: Diagnosis not present

## 2017-03-26 ENCOUNTER — Encounter: Payer: Self-pay | Admitting: Interventional Cardiology

## 2017-03-26 ENCOUNTER — Ambulatory Visit (INDEPENDENT_AMBULATORY_CARE_PROVIDER_SITE_OTHER): Payer: Medicare Other | Admitting: Interventional Cardiology

## 2017-03-26 VITALS — BP 122/68 | HR 83 | Ht 72.0 in | Wt 218.0 lb

## 2017-03-26 DIAGNOSIS — I482 Chronic atrial fibrillation, unspecified: Secondary | ICD-10-CM

## 2017-03-26 DIAGNOSIS — I5032 Chronic diastolic (congestive) heart failure: Secondary | ICD-10-CM | POA: Diagnosis not present

## 2017-03-26 DIAGNOSIS — I10 Essential (primary) hypertension: Secondary | ICD-10-CM

## 2017-03-26 DIAGNOSIS — Z7901 Long term (current) use of anticoagulants: Secondary | ICD-10-CM

## 2017-03-26 NOTE — Patient Instructions (Signed)

## 2017-03-26 NOTE — Progress Notes (Signed)
Cardiology Office Note    Date:  03/26/2017   ID:  ZHAMIR PIRRO, DOB Dec 12, 1923, MRN [id number]  PCP:  Lavone Orn, MD  Cardiologist: Sinclair Grooms, MD   Chief Complaint  Patient presents with  . Coronary Artery Disease    History of Present Illness:  Vincent Foster is a 83 y.o. male who presents for who has COPD, history of hypertension, chronic atrial fibrillation, and chronic anticoagulation therapy on Coumadin.  When last seen over a year ago we adjusted diuretic therapy downward. His wife subsequently died. He lost interest in many things. On the lower dose of diuretic he noted swelling but did not tell anyone. Eventually he developed anasarca and is now on a much more aggressive diuretic regimen by Dr. Laurann Montana. He is currently on 80 mg of furosemide twice a day. We discussed the usefulness of daily weights under similar circumstances. The importance of notifying physicians if weight increases by more than 3 pounds in one day or 5 pounds in one week.  Past Medical History:  Diagnosis Date  . Adenocarcinoma of prostate ([place])   . Allergy   . Atrial fibrillation ([place])   . Basal cell carcinoma   . CKD (chronic kidney disease), stage III ([place])   . Glaucoma   . Gout   . History of vertigo   . Hyperlipidemia   . Hypertension   . IFG (impaired fasting glucose)   . Venous stasis    Lasix PRN.    Past Surgical History:  Procedure Laterality Date  . APPENDECTOMY    . COLON SURGERY    . CYSTOSCOPY/RETROGRADE/URETEROSCOPY N/A 08/08/2015   Procedure: CYSTOSCOPY WITH BILATERAL  RETROGRADE  [id number];  Surgeon: Irine Seal, MD;  Location: [facility];  Service: Urology;  Laterality: N/A;  . PROSTATE SURGERY     Had gold seed placement  . TONSILECTOMY, ADENOIDECTOMY, BILATERAL MYRINGOTOMY AND TUBES    . TRANSURETHRAL RESECTION OF BLADDER TUMOR N/A 08/08/2015   Procedure: TRANSURETHRAL RESECTION OF BLADDER TUMOR (TURBT) ;  Surgeon: Irine Seal, MD;  Location: [facility];  Service:  Urology;  Laterality: N/A;    Current Medications: Outpatient Medications Prior to Visit  Medication Sig Dispense Refill  . allopurinol (ZYLOPRIM) 100 MG tablet Take 200 mg by mouth daily.    . Carboxymeth-Glycerin-Polysorb (REFRESH OPTIVE ADVANCED) 0.5-1-0.5 % SOLN Place 2 drops into both eyes daily as needed (dry eyes).    . cholecalciferol (VITAMIN D) 1000 units tablet Take 1,000 Units by mouth daily.    Marland Kitchen ELIQUIS 2.5 MG TABS tablet TAKE 1 TABLET TWICE DAILY. 60 tablet 5  . GuaiFENesin (MUCINEX PO) Take 1 tablet by mouth at bedtime as needed (chest congestion).    Marland Kitchen losartan (COZAAR) 50 MG tablet Take 1 tablet (50 mg total) by mouth daily. 90 tablet 3  . metoprolol (LOPRESSOR) 50 MG tablet Take 1 tablet (50 mg total) by mouth 2 (two) times daily. 180 tablet 3  . Multiple Vitamins-Minerals (PRESERVISION AREDS) CAPS Take 1 capsule by mouth daily.    . potassium chloride (K-DUR) 10 MEQ tablet Take 10 mEq by mouth 2 (two) times daily.    Marland Kitchen triamcinolone (NASACORT) 55 MCG/ACT AERO nasal inhaler Place 2 sprays into the nose at bedtime as needed (for congestion).     . furosemide (LASIX) 20 MG tablet Take 2 tablets (40 mg total) by mouth daily. (Patient not taking: Reported on 03/26/2017) 180 tablet 3  . traMADol-acetaminophen (ULTRACET) 37.5-325 MG tablet Take 1 tablet by  mouth every 6 (six) hours as needed. (Patient not taking: Reported on 03/26/2017) 12 tablet 0   No facility-administered medications prior to visit.      Allergies:   Bee venom and Tetanus toxoids   Social History   Social History  . Marital status: Married    Spouse name: N/A  . Number of children: N/A  . Years of education: N/A   Social History Main Topics  . Smoking status: Former Research scientist (life sciences)  . Smokeless tobacco: Former Systems developer    Types: Chew  . Alcohol use None  . Drug use: No  . Sexual activity: Not Asked   Other Topics Concern  . None   Social History Narrative  . None     Family History:  The patient's  family history includes Colon cancer in his sister; Dementia in his sister; Healthy in his mother; Pneumonia in his father.   ROS:   Please see the history of present illness.    Denies chest pain. Appetite had decreased but now improving with diuresis. He has not fallen or had syncope. He denies angina. His urine or stool.  All other systems reviewed and are negative.   PHYSICAL EXAM:   VS:  BP 122/68 (BP Location: Right Arm)   Pulse 83   Ht 6' (1.829 m)   Wt 218 lb (98.9 kg)   BMI 29.57 kg/m    GEN: Well nourished, well developed, in no acute distress  HEENT: normal  Neck: no JVD, carotid bruits, or masses Cardiac: iiRR; no murmurs, rubs, or gallops. 1+ edema knees to ankles. Bilaterally symmetric. Respiratory:  clear to auscultation bilaterally, normal work of breathing GI: soft, nontender, nondistended, + BS MS: no deformity or atrophy  Skin: warm and dry, no rash Neuro:  Alert and Oriented x 3, Strength and sensation are intact Psych: euthymic mood, full affect  Wt Readings from Last 3 Encounters:  03/26/17 218 lb (98.9 kg)  03/26/16 237 lb 12.8 oz (107.9 kg)  08/08/15 235 lb 8 oz (106.8 kg)      Studies/Labs Reviewed:   EKG:  EKG  Atrial fibrillation with controlled ventricular response. Right bundle branch block, Ashman's phenomenon. Septal infarct, old.  Recent Labs: 04/20/2016: BUN 30; Creat 1.47; Potassium 4.8; Sodium 137   Lipid Panel No results found for: CHOL, TRIG, HDL, CHOLHDL, VLDL, LDLCALC, LDLDIRECT  Additional studies/ records that were reviewed today include:  None    ASSESSMENT:    1. Chronic atrial fibrillation ([place])   2. Essential hypertension   3. Chronic anticoagulation   4. Chronic diastolic heart failure ([facility])      PLAN:  In order of problems listed above:  1. Nonagenarian with chronic diastolic heart failure and atrial fibrillation. I mistakenly decreased diuretic intensity on the last visit and he gradually developed anasarca .  He is now improving with aggressive diuretic therapy by Dr. Laurann Montana, initially 120 mg twice daily now 80 mg twice daily. Appetite is improving. 2. Blood pressure is adequately controlled 3. No bleeding on anticoagulation regimen 4. Continue diuresis until nadir weight without renal impairment. Advised that he wait daily under similar circumstances. Notify us of 3 pound weight gain in 24 hours or 5 pounds in 7 days. Dr. Laurann Montana will continue to manage the diuretic regimen. I'll be happy to see in the future if issues.  Follow-up should be as needed.    Medication Adjustments/Labs and Tests Ordered: Current medicines are reviewed at length with the patient today.  Concerns regarding medicines  are outlined above.  Medication changes, Labs and Tests ordered today are listed in the Patient Instructions below. Patient Instructions  Medication Instructions:  Your physician recommends that you continue on your current medications as directed. Please refer to the Current Medication list given to you today.  Labwork: None  Testing/Procedures: None  Follow-Up: Your physician wants you to follow-up in: 1 year with Dr. Tamala Julian.  You will receive a reminder letter in the mail two months in advance. If you don't receive a letter, please call our office to schedule the follow-up appointment.   Any Other Special Instructions Will Be Listed Below (If Applicable).     If you need a refill on your cardiac medications before your next appointment, please call your pharmacy.      Signed, Sinclair Grooms, MD  03/26/2017 5:05 PM    [facility] [address], [place], [place]  [address] Phone: [phone]; Fax: [phone]

## 2017-03-29 ENCOUNTER — Other Ambulatory Visit: Payer: Self-pay | Admitting: Interventional Cardiology

## 2017-03-29 DIAGNOSIS — I5030 Unspecified diastolic (congestive) heart failure: Secondary | ICD-10-CM | POA: Diagnosis not present

## 2017-03-29 DIAGNOSIS — I130 Hypertensive heart and chronic kidney disease with heart failure and stage 1 through stage 4 chronic kidney disease, or unspecified chronic kidney disease: Secondary | ICD-10-CM | POA: Diagnosis not present

## 2017-03-30 DIAGNOSIS — N183 Chronic kidney disease, stage 3 (moderate): Secondary | ICD-10-CM | POA: Diagnosis not present

## 2017-03-30 DIAGNOSIS — Z79899 Other long term (current) drug therapy: Secondary | ICD-10-CM | POA: Diagnosis not present

## 2017-03-30 DIAGNOSIS — I5030 Unspecified diastolic (congestive) heart failure: Secondary | ICD-10-CM | POA: Diagnosis not present

## 2017-03-30 DIAGNOSIS — I129 Hypertensive chronic kidney disease with stage 1 through stage 4 chronic kidney disease, or unspecified chronic kidney disease: Secondary | ICD-10-CM | POA: Diagnosis not present

## 2017-04-01 DIAGNOSIS — I130 Hypertensive heart and chronic kidney disease with heart failure and stage 1 through stage 4 chronic kidney disease, or unspecified chronic kidney disease: Secondary | ICD-10-CM | POA: Diagnosis not present

## 2017-04-01 DIAGNOSIS — I5030 Unspecified diastolic (congestive) heart failure: Secondary | ICD-10-CM | POA: Diagnosis not present

## 2017-04-05 DIAGNOSIS — Z5181 Encounter for therapeutic drug level monitoring: Secondary | ICD-10-CM | POA: Diagnosis not present

## 2017-04-19 DIAGNOSIS — I5030 Unspecified diastolic (congestive) heart failure: Secondary | ICD-10-CM | POA: Diagnosis not present

## 2017-04-19 DIAGNOSIS — Z5181 Encounter for therapeutic drug level monitoring: Secondary | ICD-10-CM | POA: Diagnosis not present

## 2017-04-19 DIAGNOSIS — M109 Gout, unspecified: Secondary | ICD-10-CM | POA: Diagnosis not present

## 2017-04-19 DIAGNOSIS — I129 Hypertensive chronic kidney disease with stage 1 through stage 4 chronic kidney disease, or unspecified chronic kidney disease: Secondary | ICD-10-CM | POA: Diagnosis not present

## 2017-05-06 DIAGNOSIS — C44329 Squamous cell carcinoma of skin of other parts of face: Secondary | ICD-10-CM | POA: Diagnosis not present

## 2017-05-06 DIAGNOSIS — C4442 Squamous cell carcinoma of skin of scalp and neck: Secondary | ICD-10-CM | POA: Diagnosis not present

## 2017-05-06 DIAGNOSIS — D485 Neoplasm of uncertain behavior of skin: Secondary | ICD-10-CM | POA: Diagnosis not present

## 2017-05-06 DIAGNOSIS — Z85828 Personal history of other malignant neoplasm of skin: Secondary | ICD-10-CM | POA: Diagnosis not present

## 2017-06-08 DIAGNOSIS — I5030 Unspecified diastolic (congestive) heart failure: Secondary | ICD-10-CM | POA: Diagnosis not present

## 2017-06-08 DIAGNOSIS — N183 Chronic kidney disease, stage 3 (moderate): Secondary | ICD-10-CM | POA: Diagnosis not present

## 2017-06-08 DIAGNOSIS — J449 Chronic obstructive pulmonary disease, unspecified: Secondary | ICD-10-CM | POA: Diagnosis not present

## 2017-06-08 DIAGNOSIS — I4891 Unspecified atrial fibrillation: Secondary | ICD-10-CM | POA: Diagnosis not present

## 2017-06-08 DIAGNOSIS — R7301 Impaired fasting glucose: Secondary | ICD-10-CM | POA: Diagnosis not present

## 2017-06-08 DIAGNOSIS — I129 Hypertensive chronic kidney disease with stage 1 through stage 4 chronic kidney disease, or unspecified chronic kidney disease: Secondary | ICD-10-CM | POA: Diagnosis not present

## 2017-06-08 DIAGNOSIS — Z1389 Encounter for screening for other disorder: Secondary | ICD-10-CM | POA: Diagnosis not present

## 2017-06-08 DIAGNOSIS — M109 Gout, unspecified: Secondary | ICD-10-CM | POA: Diagnosis not present

## 2017-06-08 DIAGNOSIS — Z0000 Encounter for general adult medical examination without abnormal findings: Secondary | ICD-10-CM | POA: Diagnosis not present

## 2017-06-16 ENCOUNTER — Other Ambulatory Visit: Payer: Self-pay | Admitting: Interventional Cardiology

## 2017-06-17 ENCOUNTER — Other Ambulatory Visit: Payer: Self-pay | Admitting: Interventional Cardiology

## 2017-06-17 NOTE — Telephone Encounter (Addendum)
Eliquis 2.5mg  refill request received; pt is 83 yrs old, wt-98.9kg, last seen by Dr. Tamala Julian on 03/26/17, and he hasn't had any recent labs in our Epic system, therefore, called PCP Dr. Delene Ruffini office & they had recent labs. Spoke with Medical Records & they will fax the labs over. Received labs & pt's Creatinine was 1.63 on 06/08/17. Will send in refill to requested pharmacy.

## 2017-08-19 DIAGNOSIS — H353131 Nonexudative age-related macular degeneration, bilateral, early dry stage: Secondary | ICD-10-CM | POA: Diagnosis not present

## 2017-08-19 DIAGNOSIS — H11003 Unspecified pterygium of eye, bilateral: Secondary | ICD-10-CM | POA: Diagnosis not present

## 2017-08-19 DIAGNOSIS — Z961 Presence of intraocular lens: Secondary | ICD-10-CM | POA: Diagnosis not present

## 2017-09-28 DIAGNOSIS — L570 Actinic keratosis: Secondary | ICD-10-CM | POA: Diagnosis not present

## 2017-09-28 DIAGNOSIS — C44222 Squamous cell carcinoma of skin of right ear and external auricular canal: Secondary | ICD-10-CM | POA: Diagnosis not present

## 2017-09-28 DIAGNOSIS — Z85828 Personal history of other malignant neoplasm of skin: Secondary | ICD-10-CM | POA: Diagnosis not present

## 2017-09-28 DIAGNOSIS — D485 Neoplasm of uncertain behavior of skin: Secondary | ICD-10-CM | POA: Diagnosis not present

## 2017-09-28 DIAGNOSIS — C44329 Squamous cell carcinoma of skin of other parts of face: Secondary | ICD-10-CM | POA: Diagnosis not present

## 2017-09-28 DIAGNOSIS — L820 Inflamed seborrheic keratosis: Secondary | ICD-10-CM | POA: Diagnosis not present

## 2017-10-04 DIAGNOSIS — Z85828 Personal history of other malignant neoplasm of skin: Secondary | ICD-10-CM | POA: Diagnosis not present

## 2017-10-04 DIAGNOSIS — C44329 Squamous cell carcinoma of skin of other parts of face: Secondary | ICD-10-CM | POA: Diagnosis not present

## 2017-12-09 DIAGNOSIS — I129 Hypertensive chronic kidney disease with stage 1 through stage 4 chronic kidney disease, or unspecified chronic kidney disease: Secondary | ICD-10-CM | POA: Diagnosis not present

## 2017-12-09 DIAGNOSIS — M109 Gout, unspecified: Secondary | ICD-10-CM | POA: Diagnosis not present

## 2017-12-09 DIAGNOSIS — I5030 Unspecified diastolic (congestive) heart failure: Secondary | ICD-10-CM | POA: Diagnosis not present

## 2017-12-09 DIAGNOSIS — Z5181 Encounter for therapeutic drug level monitoring: Secondary | ICD-10-CM | POA: Diagnosis not present

## 2017-12-09 DIAGNOSIS — Z8551 Personal history of malignant neoplasm of bladder: Secondary | ICD-10-CM | POA: Diagnosis not present

## 2017-12-09 DIAGNOSIS — J449 Chronic obstructive pulmonary disease, unspecified: Secondary | ICD-10-CM | POA: Diagnosis not present

## 2017-12-09 DIAGNOSIS — N183 Chronic kidney disease, stage 3 (moderate): Secondary | ICD-10-CM | POA: Diagnosis not present

## 2017-12-09 DIAGNOSIS — I4891 Unspecified atrial fibrillation: Secondary | ICD-10-CM | POA: Diagnosis not present

## 2017-12-30 DIAGNOSIS — D485 Neoplasm of uncertain behavior of skin: Secondary | ICD-10-CM | POA: Diagnosis not present

## 2017-12-30 DIAGNOSIS — D0439 Carcinoma in situ of skin of other parts of face: Secondary | ICD-10-CM | POA: Diagnosis not present

## 2017-12-30 DIAGNOSIS — C44629 Squamous cell carcinoma of skin of left upper limb, including shoulder: Secondary | ICD-10-CM | POA: Diagnosis not present

## 2017-12-30 DIAGNOSIS — Z85828 Personal history of other malignant neoplasm of skin: Secondary | ICD-10-CM | POA: Diagnosis not present

## 2018-01-04 DIAGNOSIS — I129 Hypertensive chronic kidney disease with stage 1 through stage 4 chronic kidney disease, or unspecified chronic kidney disease: Secondary | ICD-10-CM | POA: Diagnosis not present

## 2018-01-04 DIAGNOSIS — I5030 Unspecified diastolic (congestive) heart failure: Secondary | ICD-10-CM | POA: Diagnosis not present

## 2018-01-04 DIAGNOSIS — I4891 Unspecified atrial fibrillation: Secondary | ICD-10-CM | POA: Diagnosis not present

## 2018-01-04 DIAGNOSIS — D126 Benign neoplasm of colon, unspecified: Secondary | ICD-10-CM | POA: Diagnosis not present

## 2018-01-14 DIAGNOSIS — I5030 Unspecified diastolic (congestive) heart failure: Secondary | ICD-10-CM | POA: Diagnosis not present

## 2018-01-18 DIAGNOSIS — Z79899 Other long term (current) drug therapy: Secondary | ICD-10-CM | POA: Diagnosis not present

## 2018-01-21 DIAGNOSIS — I5030 Unspecified diastolic (congestive) heart failure: Secondary | ICD-10-CM | POA: Diagnosis not present

## 2018-01-21 DIAGNOSIS — Z79899 Other long term (current) drug therapy: Secondary | ICD-10-CM | POA: Diagnosis not present

## 2018-02-20 DEATH — deceased

## 2018-10-16 IMAGING — DX DG CHEST 2V
2 series · 2 of 2 positions shown · non-contrast
Comparison: 07/15/2016

CLINICAL DATA: Paroxysmal nocturnal dyspnea

EXAM:
CHEST  2 VIEW

[dg chest 2 view (1 of 2)]
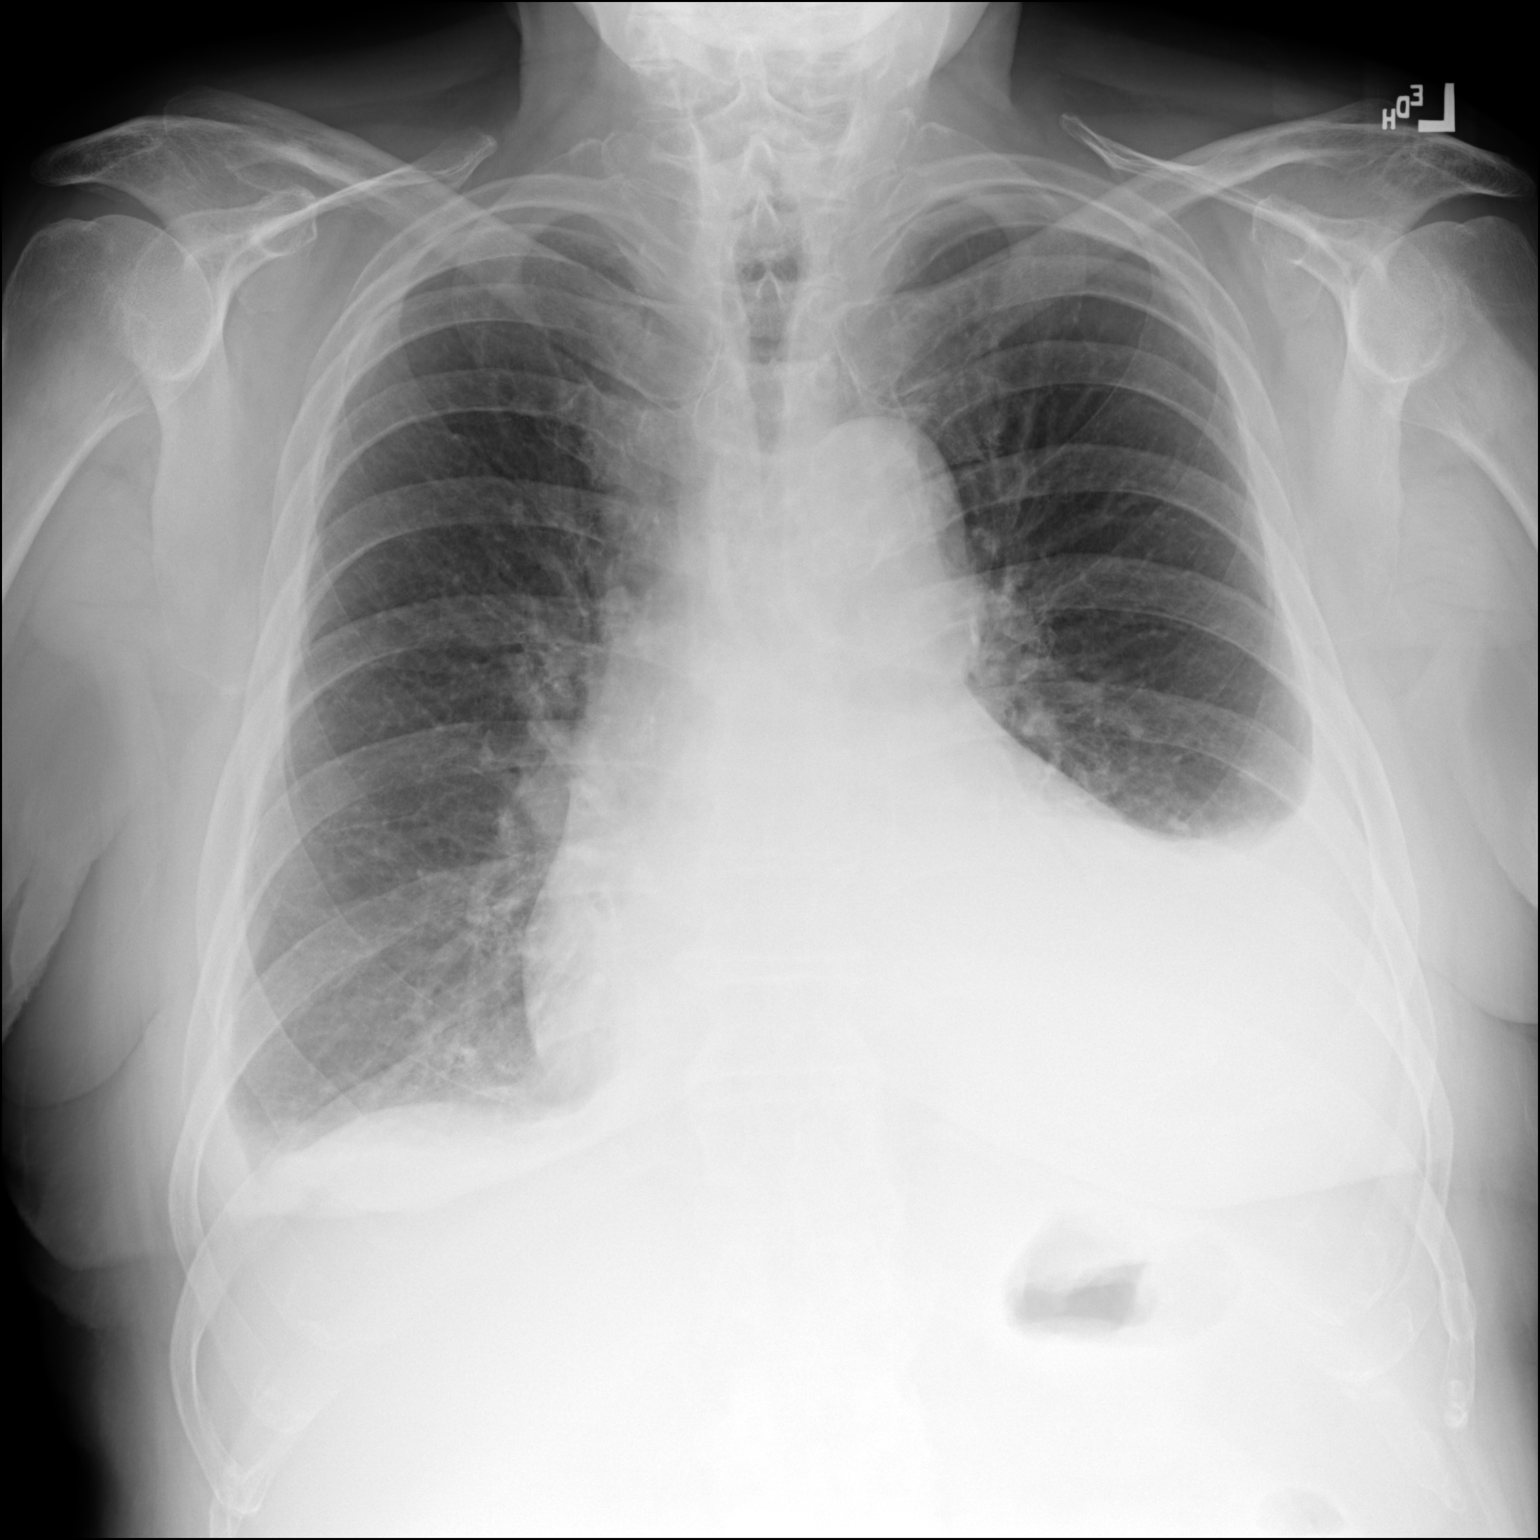

[dg chest 2 view (2 of 2)]
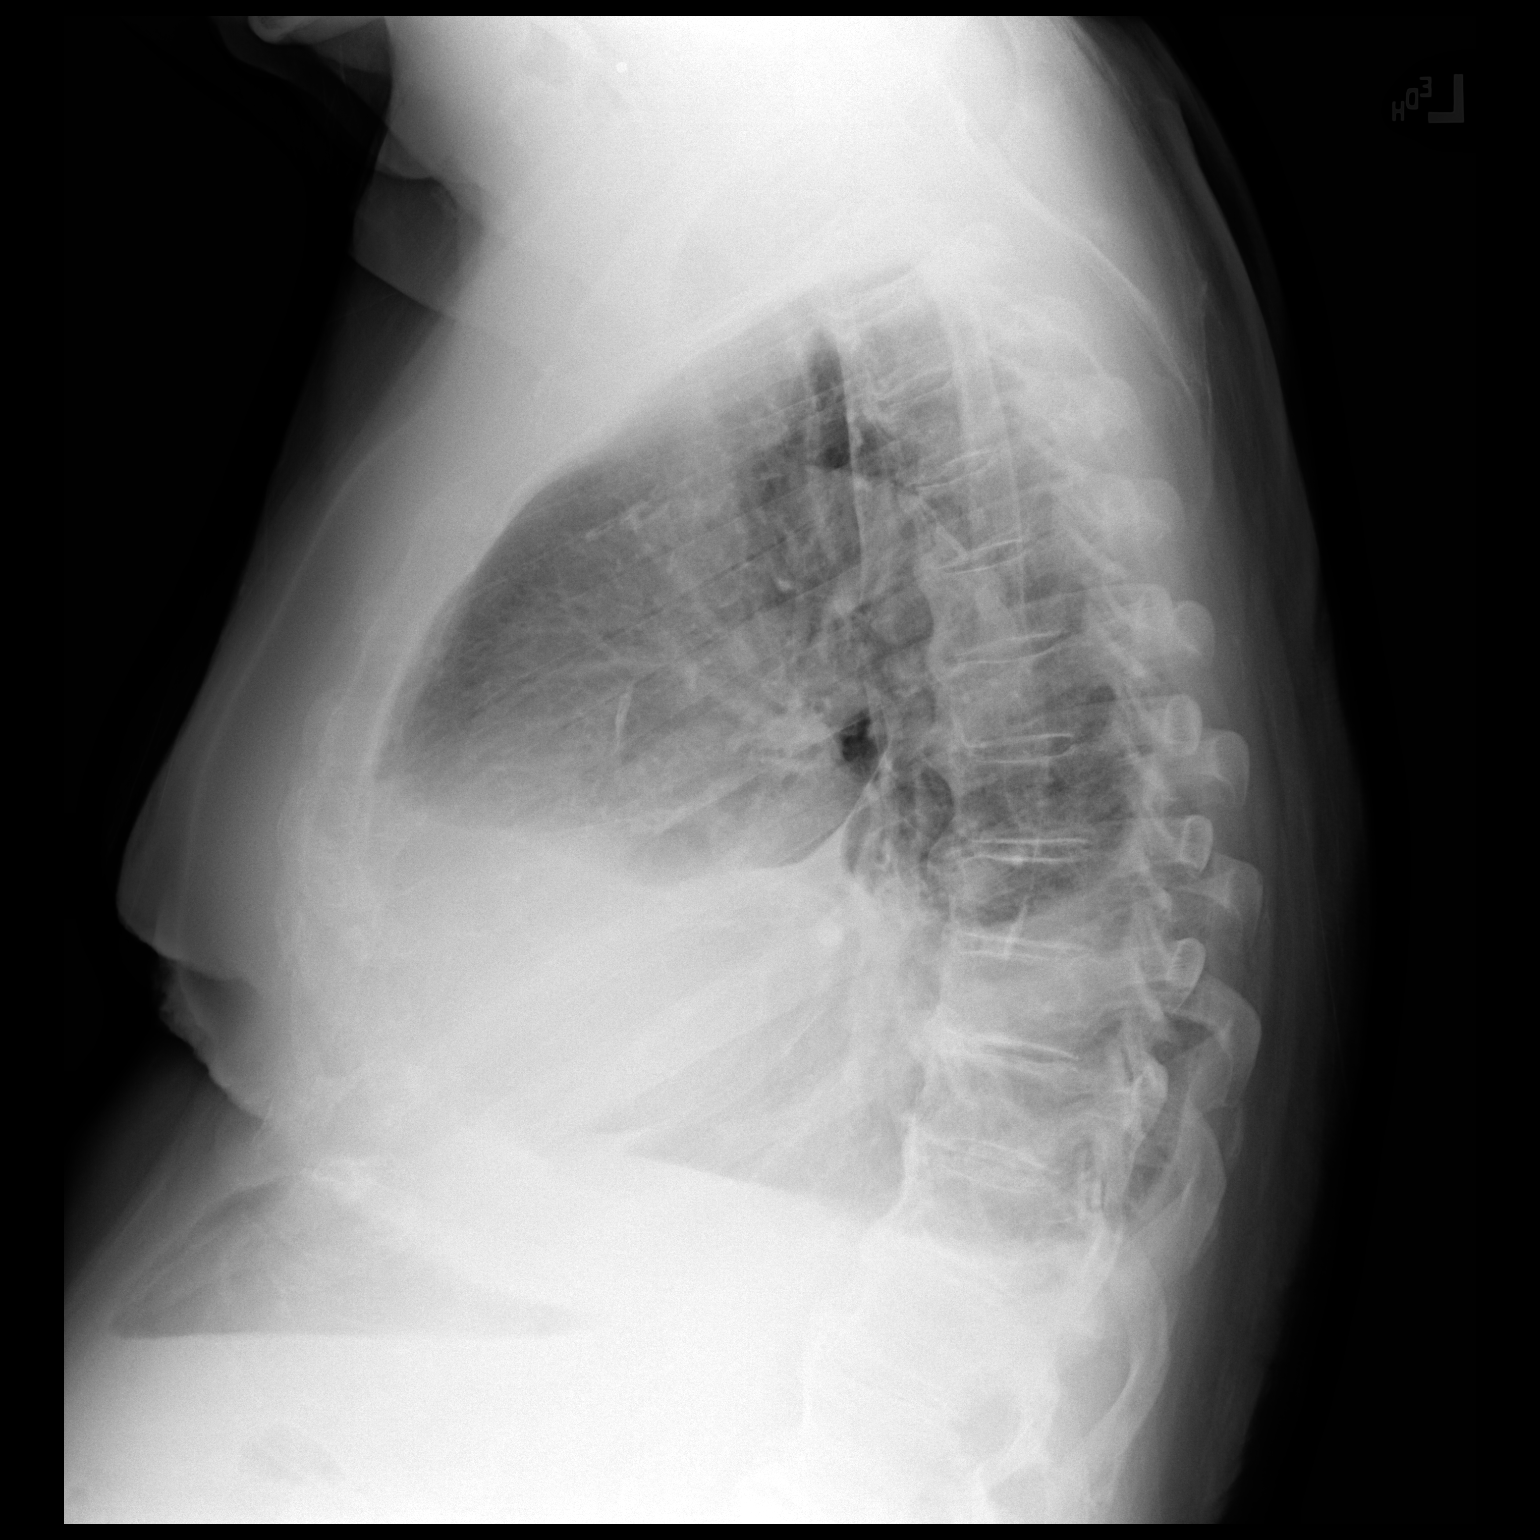

[2 of 2 positions shown; findings below may reference images not displayed]

FINDINGS: The heart is moderately enlarged. Left pleural effusion has
enlarged. Small right pleural effusion has developed. Normal
vascularity. No pneumothorax.
IMPRESSION: Bilateral pleural effusions, larger on the left and new on the
right.

Cardiomegaly.  Normal vascularity.
